# Patient Record
Sex: Female | Born: 1970 | Race: White | Hispanic: No | Smoking: Never smoker
Health system: Southern US, Community
[De-identification: ages and names within clinical notes are randomized; demographics above are authoritative.]

## PROBLEM LIST (undated history)

## (undated) DIAGNOSIS — Z8489 Family history of other specified conditions: Secondary | ICD-10-CM

## (undated) DIAGNOSIS — J45909 Unspecified asthma, uncomplicated: Secondary | ICD-10-CM

## (undated) DIAGNOSIS — M1990 Unspecified osteoarthritis, unspecified site: Secondary | ICD-10-CM

## (undated) DIAGNOSIS — Z8742 Personal history of other diseases of the female genital tract: Secondary | ICD-10-CM

## (undated) HISTORY — PX: FOOT SURGERY: SHX648

## (undated) HISTORY — DX: Personal history of other diseases of the female genital tract: Z87.42

## (undated) HISTORY — DX: Unspecified osteoarthritis, unspecified site: M19.90

## (undated) HISTORY — PX: JOINT REPLACEMENT: SHX530

## (undated) HISTORY — DX: Unspecified asthma, uncomplicated: J45.909

## (undated) HISTORY — PX: APPENDECTOMY: SHX54

---
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
## 1996-02-05 HISTORY — PX: CHOLECYSTECTOMY: SHX55

## 2009-09-14 ENCOUNTER — Ambulatory Visit: Payer: Self-pay | Admitting: Family Medicine

## 2011-10-15 ENCOUNTER — Ambulatory Visit: Payer: Self-pay

## 2012-10-23 ENCOUNTER — Encounter: Payer: Self-pay | Admitting: *Deleted

## 2012-11-02 ENCOUNTER — Other Ambulatory Visit: Payer: BC Managed Care – PPO

## 2012-11-02 ENCOUNTER — Encounter: Payer: Self-pay | Admitting: General Surgery

## 2012-11-02 ENCOUNTER — Ambulatory Visit (INDEPENDENT_AMBULATORY_CARE_PROVIDER_SITE_OTHER): Payer: BC Managed Care – PPO | Admitting: General Surgery

## 2012-11-02 VITALS — BP 120/84 | HR 68 | Resp 14 | Ht 69.5 in | Wt 229.0 lb

## 2012-11-02 DIAGNOSIS — N63 Unspecified lump in unspecified breast: Secondary | ICD-10-CM

## 2012-11-02 NOTE — Patient Instructions (Addendum)
Patient advised to continue self breast checks. She is also advised to contact our office with any new questions or concerns. Patient to return in 1 year.

## 2012-11-02 NOTE — Progress Notes (Signed)
Patient ID: Kerri Sullivan, female   DOB: Apr 23, 1970, 43 y.o.   MRN: [id number]  Chief Complaint  Patient presents with  . Other    right breast lesion    HPI Marion Seese is a 42 y.o. female who presents for an evaluation of a right breast lesion. The patient states the lesion was noticed approximately 1 year ago without any symptoms. She states she had Dr. Luella Cook check this area at that time and had a mammogram that was normal. She has noticed a second area in the last month located near her sternum. She denies any pain or symptoms except that she knows its there.    HPI  Past Medical History  Diagnosis Date  . Arthritis     rheumatoid arthritis    Past Surgical History  Procedure Laterality Date  . Cholecystectomy  1998    Family History  Problem Relation Age of Onset  . Cancer Paternal Aunt     breast  . Cancer Cousin     colon maternal side of family    Social History History  Substance Use Topics  . Smoking status: Never Smoker   . Smokeless tobacco: Never Used  . Alcohol Use: No    No Known Allergies  Current Outpatient Prescriptions  Medication Sig Dispense Refill  . methotrexate (RHEUMATREX) 2.5 MG tablet Take 1 tablet by mouth 4 (four) times a week.      . naproxen (NAPROSYN) 250 MG tablet Take 250 mg by mouth 2 (two) times daily with a meal.       No current facility-administered medications for this visit.    Review of Systems Review of Systems  Constitutional: Negative.   Respiratory: Negative.   Cardiovascular: Negative.     Blood pressure 120/84, pulse 68, resp. rate 14, height 5' 9.5" (1.765 m), weight 229 lb (103.874 kg), last menstrual period 10/14/2012.  Physical Exam Physical Exam  Constitutional: She is oriented to person, place, and time. She appears well-developed and well-nourished.  Eyes: Conjunctivae are normal. No scleral icterus.  Neck: No thyromegaly present.  Cardiovascular: Normal rate, regular rhythm and normal heart  sounds.   No murmur heard. Pulmonary/Chest: Effort normal and breath sounds normal. Right breast exhibits mass. Right breast exhibits no inverted nipple, no nipple discharge, no skin change and no tenderness. Left breast exhibits mass. Left breast exhibits no inverted nipple, no nipple discharge, no skin change and no tenderness.    Lymphadenopathy:    She has no cervical adenopathy.    She has no axillary adenopathy.  Neurological: She is alert and oriented to person, place, and time.  Skin: Skin is warm and dry.    Data Reviewed  Mammogram reportedly is normal. Ultrasound of palpable masses also normal.   Assessment    Lipomas multiple two on right one on left. No need for any intervention.     Plan    Patient to return in 1 year for follow up.        SANKAR,SEEPLAPUTHUR G 11/03/2012, 6:51 AM

## 2012-11-03 ENCOUNTER — Encounter: Payer: Self-pay | Admitting: General Surgery

## 2013-08-06 ENCOUNTER — Ambulatory Visit (INDEPENDENT_AMBULATORY_CARE_PROVIDER_SITE_OTHER): Payer: BC Managed Care – PPO | Admitting: Podiatry

## 2013-08-06 ENCOUNTER — Ambulatory Visit (INDEPENDENT_AMBULATORY_CARE_PROVIDER_SITE_OTHER): Payer: BC Managed Care – PPO

## 2013-08-06 VITALS — Ht 70.0 in | Wt 202.0 lb

## 2013-08-06 DIAGNOSIS — M722 Plantar fascial fibromatosis: Secondary | ICD-10-CM

## 2013-08-06 MED ORDER — DICLOFENAC SODIUM 75 MG PO TBEC: 75.0000 mg | DELAYED_RELEASE_TABLET | Freq: Two times a day (BID) | ORAL | Status: DC

## 2013-08-06 MED ORDER — TRIAMCINOLONE ACETONIDE 10 MG/ML IJ SUSP
10.0000 mg | Freq: Once | INTRAMUSCULAR | Status: AC
  Administered 2013-08-06: 10 mg

## 2013-08-06 NOTE — Patient Instructions (Signed)
Plantar Fasciitis (Heel Spur Syndrome) with Rehab The plantar fascia is a fibrous, ligament-like, soft-tissue structure that spans the bottom of the foot. Plantar fasciitis is a condition that causes pain in the foot due to inflammation of the tissue. SYMPTOMS   Pain and tenderness on the underneath side of the foot.  Pain that worsens with standing or walking. CAUSES  Plantar fasciitis is caused by irritation and injury to the plantar fascia on the underneath side of the foot. Common mechanisms of injury include:  Direct trauma to bottom of the foot.  Damage to a small nerve that runs under the foot where the main fascia attaches to the heel bone.  Stress placed on the plantar fascia due to bone spurs. RISK INCREASES WITH:   Activities that place stress on the plantar fascia (running, jumping, pivoting, or cutting).  Poor strength and flexibility.  Improperly fitted shoes.  Tight calf muscles.  Flat feet.  Failure to warm-up properly before activity.  Obesity. PREVENTION  Warm up and stretch properly before activity.  Allow for adequate recovery between workouts.  Maintain physical fitness:  Strength, flexibility, and endurance.  Cardiovascular fitness.  Maintain a health body weight.  Avoid stress on the plantar fascia.  Wear properly fitted shoes, including arch supports for individuals who have flat feet. PROGNOSIS  If treated properly, then the symptoms of plantar fasciitis usually resolve without surgery. However, occasionally surgery is necessary. RELATED COMPLICATIONS   Recurrent symptoms that may result in a chronic condition.  Problems of the lower back that are caused by compensating for the injury, such as limping.  Pain or weakness of the foot during push-off following surgery.  Chronic inflammation, scarring, and partial or complete fascia tear, occurring more often from repeated injections. TREATMENT  Treatment initially involves the use of  ice and medication to help reduce pain and inflammation. The use of strengthening and stretching exercises may help reduce pain with activity, especially stretches of the Achilles tendon. These exercises may be performed at home or with a therapist. Your caregiver may recommend that you use heel cups of arch supports to help reduce stress on the plantar fascia. Occasionally, corticosteroid injections are given to reduce inflammation. If symptoms persist for greater than 6 months despite non-surgical (conservative), then surgery may be recommended.  MEDICATION   If pain medication is necessary, then nonsteroidal anti-inflammatory medications, such as aspirin and ibuprofen, or other minor pain relievers, such as acetaminophen, are often recommended.  Do not take pain medication within 7 days before surgery.  Prescription pain relievers may be given if deemed necessary by your caregiver. Use only as directed and only as much as you need.  Corticosteroid injections may be given by your caregiver. These injections should be reserved for the most serious cases, because they may only be given a certain number of times. HEAT AND COLD  Cold treatment (icing) relieves pain and reduces inflammation. Cold treatment should be applied for 10 to 15 minutes every 2 to 3 hours for inflammation and pain and immediately after any activity that aggravates your symptoms. Use ice packs or massage the area with a piece of ice (ice massage).  Heat treatment may be used prior to performing the stretching and strengthening activities prescribed by your caregiver, physical therapist, or athletic trainer. Use a heat pack or soak the injury in warm water. SEEK IMMEDIATE MEDICAL CARE IF:  Treatment seems to offer no benefit, or the condition worsens.  Any medications produce adverse side effects. EXERCISES RANGE   OF MOTION (ROM) AND STRETCHING EXERCISES - Plantar Fasciitis (Heel Spur Syndrome) These exercises may help you  when beginning to rehabilitate your injury. Your symptoms may resolve with or without further involvement from your physician, physical therapist or athletic trainer. While completing these exercises, remember:   Restoring tissue flexibility helps normal motion to return to the joints. This allows healthier, less painful movement and activity.  An effective stretch should be held for at least 30 seconds.  A stretch should never be painful. You should only feel a gentle lengthening or release in the stretched tissue. RANGE OF MOTION - Toe Extension, Flexion  Sit with your right / left leg crossed over your opposite knee.  Grasp your toes and gently pull them back toward the top of your foot. You should feel a stretch on the bottom of your toes and/or foot.  Hold this stretch for __________ seconds.  Now, gently pull your toes toward the bottom of your foot. You should feel a stretch on the top of your toes and or foot.  Hold this stretch for __________ seconds. Repeat __________ times. Complete this stretch __________ times per day.  RANGE OF MOTION - Ankle Dorsiflexion, Active Assisted  Remove shoes and sit on a chair that is preferably not on a carpeted surface.  Place right / left foot under knee. Extend your opposite leg for support.  Keeping your heel down, slide your right / left foot back toward the chair until you feel a stretch at your ankle or calf. If you do not feel a stretch, slide your bottom forward to the edge of the chair, while still keeping your heel down.  Hold this stretch for __________ seconds. Repeat __________ times. Complete this stretch __________ times per day.  STRETCH - Gastroc, Standing  Place hands on wall.  Extend right / left leg, keeping the front knee somewhat bent.  Slightly point your toes inward on your back foot.  Keeping your right / left heel on the floor and your knee straight, shift your weight toward the wall, not allowing your back to  arch.  You should feel a gentle stretch in the right / left calf. Hold this position for __________ seconds. Repeat __________ times. Complete this stretch __________ times per day. STRETCH - Soleus, Standing  Place hands on wall.  Extend right / left leg, keeping the other knee somewhat bent.  Slightly point your toes inward on your back foot.  Keep your right / left heel on the floor, bend your back knee, and slightly shift your weight over the back leg so that you feel a gentle stretch deep in your back calf.  Hold this position for __________ seconds. Repeat __________ times. Complete this stretch __________ times per day. STRETCH - Gastrocsoleus, Standing  Note: This exercise can place a lot of stress on your foot and ankle. Please complete this exercise only if specifically instructed by your caregiver.   Place the ball of your right / left foot on a step, keeping your other foot firmly on the same step.  Hold on to the wall or a rail for balance.  Slowly lift your other foot, allowing your body weight to press your heel down over the edge of the step.  You should feel a stretch in your right / left calf.  Hold this position for __________ seconds.  Repeat this exercise with a slight bend in your right / left knee. Repeat __________ times. Complete this stretch __________ times per day.    STRENGTHENING EXERCISES - Plantar Fasciitis (Heel Spur Syndrome)  These exercises may help you when beginning to rehabilitate your injury. They may resolve your symptoms with or without further involvement from your physician, physical therapist or athletic trainer. While completing these exercises, remember:   Muscles can gain both the endurance and the strength needed for everyday activities through controlled exercises.  Complete these exercises as instructed by your physician, physical therapist or athletic trainer. Progress the resistance and repetitions only as guided. STRENGTH -  Towel Curls  Sit in a chair positioned on a non-carpeted surface.  Place your foot on a towel, keeping your heel on the floor.  Pull the towel toward your heel by only curling your toes. Keep your heel on the floor.  If instructed by your physician, physical therapist or athletic trainer, add ____________________ at the end of the towel. Repeat __________ times. Complete this exercise __________ times per day. STRENGTH - Ankle Inversion  Secure one end of a rubber exercise band/tubing to a fixed object (table, pole). Loop the other end around your foot just before your toes.  Place your fists between your knees. This will focus your strengthening at your ankle.  Slowly, pull your big toe up and in, making sure the band/tubing is positioned to resist the entire motion.  Hold this position for __________ seconds.  Have your muscles resist the band/tubing as it slowly pulls your foot back to the starting position. Repeat __________ times. Complete this exercises __________ times per day.  Document Released: 01/21/2005 Document Revised: 04/15/2011 Document Reviewed: 05/05/2008 [employer] Patient Information 2015 [employer]. This information is not intended to replace advice given to you by your health care provider. Make sure you discuss any questions you have with your health care provider.  

## 2013-08-06 NOTE — Progress Notes (Signed)
Subjective:     Patient ID: Kerri Sullivan, female   DOB: [birth year], 43 y.o.   MRN: [id number]  Foot Pain   patient presents stating the bottom my right heel has been awful and has not responded to immobilization which is no longer working after all these years the boot is broken and trying to reduce activity and physical therapy   Review of Systems  All other systems reviewed and are negative.      Objective:   Physical Exam  Nursing note and vitals reviewed. Constitutional: She is oriented to person, place, and time.  Cardiovascular: Intact distal pulses.   Musculoskeletal: Normal range of motion.  Neurological: She is oriented to person, place, and time.  Skin: Skin is warm.   neurovascular status intact with muscle strength adequate and range of motion subtalar midtarsal joint within normal limits. Patient is found to have exquisite discomfort and pain right plantar heel at the insertion of the tendon into the calcaneus and is noted to have digits that are well-perfused and a well-developed arch height upon weightbearing    Assessment:      severe plantar fasciitis plantar aspect right heel    Plan:     H&P and x-rays reviewed and injected the right plantar fascia 3 mg Kenalog 5 mg Xylocaine Marcaine mixture and applied fascially brace with instructions along with diclofenac 75 mg twice a day dispensed air fracture walker for usage and reappoint in 3 weeks

## 2013-08-06 NOTE — Progress Notes (Signed)
   Subjective:    Patient ID: Kerri Sullivan, female    DOB: 08-05-1970, 43 y.o.   MRN: [id number]  HPI Comments: Right plantar heel pain , the heel has flared up over the last two weeks.  Foot Pain      Review of Systems  All other systems reviewed and are negative.      Objective:   Physical Exam        Assessment & Plan:

## 2013-08-27 ENCOUNTER — Ambulatory Visit: Payer: BC Managed Care – PPO | Admitting: Podiatry

## 2013-11-04 ENCOUNTER — Ambulatory Visit: Payer: BC Managed Care – PPO | Admitting: General Surgery

## 2013-11-16 ENCOUNTER — Ambulatory Visit (INDEPENDENT_AMBULATORY_CARE_PROVIDER_SITE_OTHER): Payer: BC Managed Care – PPO | Admitting: General Surgery

## 2013-11-16 ENCOUNTER — Encounter: Payer: Self-pay | Admitting: General Surgery

## 2013-11-16 VITALS — BP 130/76 | HR 74 | Resp 12 | Ht 70.0 in | Wt 214.0 lb

## 2013-11-16 DIAGNOSIS — N6310 Unspecified lump in the right breast, unspecified quadrant: Secondary | ICD-10-CM | POA: Insufficient documentation

## 2013-11-16 DIAGNOSIS — N63 Unspecified lump in breast: Secondary | ICD-10-CM

## 2013-11-16 DIAGNOSIS — N6320 Unspecified lump in the left breast, unspecified quadrant: Principal | ICD-10-CM

## 2013-11-16 NOTE — Patient Instructions (Signed)
Continue self breast exams. Call office for any new breast issues or concerns. Follow up as needed.

## 2013-11-16 NOTE — Progress Notes (Signed)
Patient ID: Kerri Sullivan, female   DOB: 03/08/1970, 43 y.o.   MRN: [id number]  Chief Complaint  Patient presents with  . Follow-up    mammogram    HPI Kerri Sullivan is a 43 y.o. female.  who presents for her follow up mammogram and breast evaluation. The most recent mammogram was done on 11-09-13.  Patient does perform regular self breast checks and gets regular mammograms done.  No new breast issues. A year ago she was noted  to have small superficial masses at medial end of each breast. No change noted on these per patient.   HPI  Past Medical History  Diagnosis Date  . Arthritis     rheumatoid arthritis    Past Surgical History  Procedure Laterality Date  . Cholecystectomy  1998    Family History  Problem Relation Age of Onset  . Cancer Paternal Aunt     breast  . Cancer Cousin     colon maternal side of family    Social History History  Substance Use Topics  . Smoking status: Never Smoker   . Smokeless tobacco: Never Used  . Alcohol Use: No    No Known Allergies  No current outpatient prescriptions on file.   No current facility-administered medications for this visit.    Review of Systems Review of Systems  Constitutional: Negative.   Respiratory: Negative.   Cardiovascular: Negative.     Blood pressure 130/76, pulse 74, resp. rate 12, height 5\' 10"  (1.778 m), weight 214 lb (97.07 kg), last menstrual period 10/28/2013.  Physical Exam Physical Exam  Constitutional: She is oriented to person, place, and time. She appears well-developed and well-nourished.  Eyes: Conjunctivae are normal. No scleral icterus.  Neck: Neck supple. No thyromegaly present.  Cardiovascular: Normal rate, regular rhythm and normal heart sounds.   Pulmonary/Chest: Effort normal and breath sounds normal. Right breast exhibits mass (Mass present. Unchanged from before). Right breast exhibits no inverted nipple, no nipple discharge, no skin change and no tenderness. Left breast exhibits  mass (Mass left breast. Unchanged from before). Left breast exhibits no inverted nipple, no nipple discharge, no skin change and no tenderness.  Abdominal: Soft. Bowel sounds are normal. There is no hepatosplenomegaly. There is no tenderness.  Lymphadenopathy:    She has no cervical adenopathy.    She has no axillary adenopathy.  Neurological: She is alert and oriented to person, place, and time.  Skin: Skin is warm and dry.    Data Reviewed Mammogram reviewed and stable  Assessment    Two small masses medial end of breasts. Likely lipomas. Previous ultrasound was normal. No need for intervention.     Plan    Continue monthly self breast exams along with annual mammograms with physician exam. Call office for any new breast issues or concerns. Follow up as needed.       SANKAR,SEEPLAPUTHUR G 11/16/2013, 8:09 PM

## 2013-12-06 ENCOUNTER — Encounter: Payer: Self-pay | Admitting: General Surgery

## 2015-09-06 ENCOUNTER — Encounter: Payer: Self-pay | Admitting: *Deleted

## 2015-09-07 ENCOUNTER — Ambulatory Visit (INDEPENDENT_AMBULATORY_CARE_PROVIDER_SITE_OTHER): Payer: BC Managed Care – PPO | Admitting: General Surgery

## 2015-09-07 ENCOUNTER — Encounter: Payer: Self-pay | Admitting: General Surgery

## 2015-09-07 VITALS — BP 128/78 | HR 88 | Resp 12 | Ht 70.0 in | Wt 209.0 lb

## 2015-09-07 DIAGNOSIS — D170 Benign lipomatous neoplasm of skin and subcutaneous tissue of head, face and neck: Secondary | ICD-10-CM | POA: Diagnosis not present

## 2015-09-07 NOTE — Patient Instructions (Addendum)
Recommend excision of mass with same day surgery The patient is aware to call back for any questions or concerns.  The patient has been scheduled for surgery at [facility] on 09/21/15. She will pre admit by phone. The patient is aware of date and instructions.

## 2015-09-07 NOTE — Progress Notes (Addendum)
Patient ID: Kerri Sullivan, female   DOB: [birth year], 45 y.o.   MRN: [id number]  Chief Complaint  Patient presents with  . Lipoma    HPI Kerri Sullivan is a 45 y.o. female is here today for an evaluation of a lipoma on her upper back/base of the neck. She state sit has been there for 5 years but has gotten larger over the past year. No pain but she does have some right shoulder discomfort occasionally. No other lesions like this one exist on the rest of her body. 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 I have reviewed the history of present illness with the patient.  HPI  Past Medical History:  Diagnosis Date  . Arthritis    rheumatoid arthritis    Past Surgical History:  Procedure Laterality Date  . CHOLECYSTECTOMY  1998    Family History  Problem Relation Age of Onset  . Cancer Paternal Aunt     breast  . Cancer Cousin     colon maternal side of family    Social History Social History  Substance Use Topics  . Smoking status: Never Smoker  . Smokeless tobacco: Never Used  . Alcohol use No    No Known Allergies  No current outpatient prescriptions on file.   No current facility-administered medications for this visit.     Review of Systems Review of Systems  Constitutional: Negative.   Respiratory: Negative.   Cardiovascular: Negative.     Blood pressure 128/78, pulse 88, resp. rate 12, height 5\' 10"  (1.778 m), weight 209 lb (94.8 kg), last menstrual period 08/28/2015.  Physical Exam Physical Exam  Constitutional: She is oriented to person, place, and time. She appears well-developed and well-nourished.  Neck: Neck supple.    Cardiovascular: Normal rate, regular rhythm and normal heart sounds.   Pulmonary/Chest: Effort normal and breath sounds normal.  Lymphadenopathy:    She has no cervical adenopathy.  Neurological: She is alert and oriented to person, place, and time.  Skin: Skin is warm and dry.  5 x 4 cm soft subcutaneous mass right  posterior base of the neck.  Psychiatric: Her behavior is normal.    Data Reviewed Notes provided by PCP  Assessment    Lipoma at the base of the neck    Plan    Excision. The risks and benefits of a lipoma excision were discussed with the patient.     The patient has been scheduled for surgery at [facility] on 09/21/15. She will pre admit by phone. The patient is aware of date and instructions.  This information has been scribed by Karie Fetch RN, BSN,BC.  Kerri Sullivan 09/07/2015, 4:44 PM

## 2015-09-13 ENCOUNTER — Encounter
Admission: RE | Admit: 2015-09-13 | Discharge: 2015-09-13 | Disposition: A | Discharge status: Home / Self Care | Payer: BC Managed Care – PPO | Source: Ambulatory Visit | Attending: General Surgery | Admitting: General Surgery

## 2015-09-13 HISTORY — DX: Family history of other specified conditions: Z84.89

## 2015-09-13 NOTE — Patient Instructions (Signed)
  Your procedure is scheduled on: 09-21-15 Report to Same Day Surgery 2nd floor medical mall To find out your arrival time please call [phone] between 1PM - 3PM on 09-20-15  Remember: Instructions that are not followed completely may result in serious medical risk, up to and including death, or upon the discretion of your surgeon and anesthesiologist your surgery may need to be rescheduled.    _x___ 1. Do not eat food or drink liquids after midnight. No gum chewing or hard candies.     __x__ 2. No Alcohol for 24 hours before or after surgery.   __x__3. No Smoking for 24 prior to surgery.   ____  4. Bring all medications with you on the day of surgery if instructed.    __x__ 5. Notify your doctor if there is any change in your medical condition     (cold, fever, infections).     Do not wear jewelry, make-up, hairpins, clips or nail polish.  Do not wear lotions, powders, or perfumes. You may wear deodorant.  Do not shave 48 hours prior to surgery. Men may shave face and neck.  Do not bring valuables to the hospital.    [facility] is not responsible for any belongings or valuables.               Contacts, dentures or bridgework may not be worn into surgery.  Leave your suitcase in the car. After surgery it may be brought to your room.  For patients admitted to the hospital, discharge time is determined by your treatment team.   Patients discharged the day of surgery will not be allowed to drive home.    Please read over the following fact sheets that you were given:   [facility] Preparing for Surgery and or MRSA Information   ____ Take these medicines the morning of surgery with A SIP OF WATER:    1. NONE  2.  3.  4.  5.  6.  ____ Fleet Enema (as directed)   ____ Use CHG Soap or sage wipes as directed on instruction sheet   ____ Use inhalers on the day of surgery and bring to hospital day of surgery  ____ Stop metformin 2 days prior to surgery    ____ Take 1/2 of  usual insulin dose the night before surgery and none on the morning of  surgery.   ____ Stop aspirin or coumadin, or plavix  _x__ Stop Anti-inflammatories such as Advil, Aleve, Ibuprofen, Motrin, Naproxen,          Naprosyn, Goodies powders or aspirin products NOW-Ok to take Tylenol.   ____ Stop supplements until after surgery.    ____ Bring C-Pap to the hospital.

## 2015-09-21 ENCOUNTER — Ambulatory Visit: Payer: BC Managed Care – PPO | Admitting: Anesthesiology

## 2015-09-21 ENCOUNTER — Encounter: Payer: Self-pay | Admitting: *Deleted

## 2015-09-21 ENCOUNTER — Encounter
Admission: RE | Disposition: A | Discharge status: Home / Self Care | Payer: Self-pay | Source: Ambulatory Visit | Attending: General Surgery

## 2015-09-21 ENCOUNTER — Ambulatory Visit
Admission: RE | Admit: 2015-09-21 | Discharge: 2015-09-21 | Disposition: A | Discharge status: Home / Self Care | Payer: BC Managed Care – PPO | Source: Ambulatory Visit | Attending: General Surgery | Admitting: General Surgery

## 2015-09-21 DIAGNOSIS — M069 Rheumatoid arthritis, unspecified: Secondary | ICD-10-CM | POA: Insufficient documentation

## 2015-09-21 DIAGNOSIS — D170 Benign lipomatous neoplasm of skin and subcutaneous tissue of head, face and neck: Secondary | ICD-10-CM | POA: Diagnosis not present

## 2015-09-21 HISTORY — PX: LIPOMA EXCISION: SHX5283

## 2015-09-21 LAB — POCT PREGNANCY, URINE: Preg Test, Ur: NEGATIVE

## 2015-09-21 SURGERY — EXCISION LIPOMA
Anesthesia: General | Wound class: Clean

## 2015-09-21 MED ORDER — CHLORHEXIDINE GLUCONATE CLOTH 2 % EX PADS
6.0000 | MEDICATED_PAD | Freq: Once | CUTANEOUS | Status: DC
Start: 2015-09-21 — End: 2015-09-21

## 2015-09-21 MED ORDER — SUCCINYLCHOLINE CHLORIDE 20 MG/ML IJ SOLN
INTRAMUSCULAR | Status: DC | PRN
  Administered 2015-09-21: 100 mg via INTRAVENOUS

## 2015-09-21 MED ORDER — LIDOCAINE-EPINEPHRINE 1 %-1:100000 IJ SOLN
INTRAMUSCULAR | Status: AC
  Filled 2015-09-21: qty 1

## 2015-09-21 MED ORDER — ONDANSETRON HCL 4 MG/2ML IJ SOLN: 4.0000 mg | Freq: Once | INTRAMUSCULAR | Status: DC | PRN

## 2015-09-21 MED ORDER — MIDAZOLAM HCL 2 MG/2ML IJ SOLN
INTRAMUSCULAR | Status: DC | PRN
  Administered 2015-09-21: 2 mg via INTRAVENOUS

## 2015-09-21 MED ORDER — TRAMADOL HCL 50 MG PO TABS: 50.0000 mg | ORAL_TABLET | Freq: Four times a day (QID) | ORAL | 0 refills | Status: DC | PRN

## 2015-09-21 MED ORDER — FAMOTIDINE 20 MG PO TABS
ORAL_TABLET | ORAL | Status: AC
  Administered 2015-09-21: 20 mg via ORAL
  Filled 2015-09-21: qty 1

## 2015-09-21 MED ORDER — PROPOFOL 10 MG/ML IV BOLUS
INTRAVENOUS | Status: DC | PRN
  Administered 2015-09-21: 150 mg via INTRAVENOUS
  Administered 2015-09-21: 50 mg via INTRAVENOUS

## 2015-09-21 MED ORDER — FAMOTIDINE 20 MG PO TABS
20.0000 mg | ORAL_TABLET | Freq: Once | ORAL | Status: AC
  Administered 2015-09-21: 20 mg via ORAL

## 2015-09-21 MED ORDER — BUPIVACAINE HCL (PF) 0.5 % IJ SOLN
INTRAMUSCULAR | Status: DC | PRN
  Administered 2015-09-21: 20 mL

## 2015-09-21 MED ORDER — ROCURONIUM BROMIDE 100 MG/10ML IV SOLN
INTRAVENOUS | Status: DC | PRN
  Administered 2015-09-21: 10 mg via INTRAVENOUS

## 2015-09-21 MED ORDER — BUPIVACAINE HCL (PF) 0.5 % IJ SOLN
INTRAMUSCULAR | Status: AC
  Filled 2015-09-21: qty 30

## 2015-09-21 MED ORDER — FENTANYL CITRATE (PF) 100 MCG/2ML IJ SOLN
INTRAMUSCULAR | Status: DC | PRN
  Administered 2015-09-21: 50 ug via INTRAVENOUS
  Administered 2015-09-21: 100 ug via INTRAVENOUS

## 2015-09-21 MED ORDER — FENTANYL CITRATE (PF) 100 MCG/2ML IJ SOLN: 25.0000 ug | INTRAMUSCULAR | Status: DC | PRN

## 2015-09-21 MED ORDER — LACTATED RINGERS IV SOLN
INTRAVENOUS | Status: DC
  Administered 2015-09-21: 07:00:00 via INTRAVENOUS

## 2015-09-21 MED ORDER — DEXAMETHASONE SODIUM PHOSPHATE 10 MG/ML IJ SOLN
INTRAMUSCULAR | Status: DC | PRN
  Administered 2015-09-21: 10 mg via INTRAVENOUS

## 2015-09-21 MED ORDER — ONDANSETRON HCL 4 MG/2ML IJ SOLN
INTRAMUSCULAR | Status: DC | PRN
  Administered 2015-09-21: 4 mg via INTRAVENOUS

## 2015-09-21 SURGICAL SUPPLY — 28 items
CANISTER SUCT 1200ML W/VALVE (MISCELLANEOUS) ×3 IMPLANT
CHLORAPREP W/TINT 26ML (MISCELLANEOUS) ×3 IMPLANT
CLOSURE WOUND 1/2 X4 (GAUZE/BANDAGES/DRESSINGS)
DRAPE LAPAROTOMY 100X77 ABD (DRAPES) ×3 IMPLANT
DRESSING TELFA 4X3 1S ST N-ADH (GAUZE/BANDAGES/DRESSINGS) IMPLANT
DRSG TEGADERM 4X4.75 (GAUZE/BANDAGES/DRESSINGS) IMPLANT
ELECT REM PT RETURN 9FT ADLT (ELECTROSURGICAL) ×3
ELECTRODE REM PT RTRN 9FT ADLT (ELECTROSURGICAL) ×1 IMPLANT
GAUZE SPONGE 4X4 12PLY STRL (GAUZE/BANDAGES/DRESSINGS) IMPLANT
GLOVE BIO SURGEON STRL SZ7 (GLOVE) ×18 IMPLANT
GOWN STRL REUS W/ TWL LRG LVL3 (GOWN DISPOSABLE) ×3 IMPLANT
GOWN STRL REUS W/TWL LRG LVL3 (GOWN DISPOSABLE) ×6
KIT RM TURNOVER STRD PROC AR (KITS) ×3 IMPLANT
LABEL OR SOLS (LABEL) ×3 IMPLANT
LIQUID BAND (GAUZE/BANDAGES/DRESSINGS) ×3 IMPLANT
NEEDLE HYPO 25X1 1.5 SAFETY (NEEDLE) ×3 IMPLANT
PACK BASIN MINOR ARMC (MISCELLANEOUS) ×3 IMPLANT
STRIP CLOSURE SKIN 1/2X4 (GAUZE/BANDAGES/DRESSINGS) IMPLANT
SUT ETHILON 4-0 (SUTURE)
SUT ETHILON 4-0 FS2 18XMFL BLK (SUTURE)
SUT VIC AB 2-0 CT1 (SUTURE) ×3 IMPLANT
SUT VIC AB 3-0 SH 27 (SUTURE)
SUT VIC AB 3-0 SH 27X BRD (SUTURE) IMPLANT
SUT VIC AB 4-0 FS2 27 (SUTURE) ×3 IMPLANT
SUT VICRYL+ 3-0 144IN (SUTURE) IMPLANT
SUTURE ETHLN 4-0 FS2 18XMF BLK (SUTURE) IMPLANT
SWABSTK COMLB BENZOIN TINCTURE (MISCELLANEOUS) IMPLANT
SYR CONTROL 10ML (SYRINGE) ×3 IMPLANT

## 2015-09-21 NOTE — Op Note (Addendum)
Preop diagnosis: Lipoma base of neck posterior  Post op diagnosis: Same  Operation: Excision lipoma back of neck involving the fascia  Surgeon: Mckinley Jewel  Assistant:     Anesthesia: Gen.  Complications: None  EBL: Less than 10 mL  Drains: None  Description: Patient was put to sleep with an endotracheal tube on the stretcher and then placed in the prone position the operating table with attention to directed to all pressure points and the proper positioning. The area the lipoma located to the right of the midline in the base of the neck was adequately exposed and then prepped and draped as sterile field. Timeout was. A total of 20 mL of 0.5% Marcaine was instilled for postop analgesia. Skin incision was made transversely overlying this mass and then deepened through to find a lipoma that was not well circumscribed but appeared to be tethered in places to the subcutaneous tissue and skin and also to the fascia below. With both blunt and sharp dissection the lipoma was subsequently excised out fully taking a small amount of fascia and underlying muscle which was involved with this. Bleeding was controlled cautery. After ensuring hemostasis the area was irrigated out and closed with with 2-0 Vicryl in the deeper tissue. Skin closed with subcuticular 4-0 Vicryl covered with liqui  Ban. The excised specimen was sent to pathology in formalin. Patient was subsequently extubated and versed to the supine position on the stretcher and then moved to recovery room in stable condition

## 2015-09-21 NOTE — Interval H&P Note (Signed)
History and Physical Interval Note:  09/21/2015 6:59 AM  Kerri Sullivan  has presented today for surgery, with the diagnosis of [place]  The various methods of treatment have been discussed with the patient and family. After consideration of risks, benefits and other options for treatment, the patient has consented to  Procedure(s): [place] (N/A) as a surgical intervention .  The patient's history has been reviewed, patient examined, no change in status, stable for surgery.  I have reviewed the patient's chart and labs.  Questions were answered to the patient's satisfaction.     [name] G

## 2015-09-21 NOTE — Anesthesia Procedure Notes (Signed)
Procedure Name: Intubation Date/Time: 09/21/2015 7:41 AM Performed by: Jonna Clark Pre-anesthesia Checklist: Patient identified, Patient being monitored, Timeout performed, Emergency Drugs available and Suction available Patient Re-evaluated:Patient Re-evaluated prior to inductionOxygen Delivery Method: Circle system utilized Preoxygenation: Pre-oxygenation with 100% oxygen Intubation Type: IV induction Ventilation: Mask ventilation without difficulty Laryngoscope Size: Mac and 3 Grade View: Grade I Tube type: Oral Tube size: 7.0 mm Number of attempts: 1 Placement Confirmation: ETT inserted through vocal cords under direct vision,  positive ETCO2 and breath sounds checked- equal and bilateral Secured at: 21 cm Tube secured with: Tape Dental Injury: Teeth and Oropharynx as per pre-operative assessment

## 2015-09-21 NOTE — Transfer of Care (Signed)
Immediate Anesthesia Transfer of Care Note  Patient: Kerri Sullivan  Procedure(s) Performed: Procedure(s): EXCISION LIPOMA BACK OF NECK (N/A)  Patient Location: PACU  Anesthesia Type:General  Level of Consciousness: patient cooperative and lethargic  Airway & Oxygen Therapy: Patient Spontanous Breathing and Patient connected to face mask oxygen  Post-op Assessment: Report given to RN and Post -op Vital signs reviewed and stable  Post vital signs: Reviewed and stable  Last Vitals:  Vitals:   09/21/15 0608  BP: 126/87  Pulse: 88  Resp: 16  Temp: 36.6 C    Last Pain:  Vitals:   09/21/15 0608  TempSrc: Tympanic  PainSc: 0-No pain         Complications: No apparent anesthesia complications

## 2015-09-21 NOTE — Anesthesia Preprocedure Evaluation (Addendum)
Anesthesia Evaluation  Patient identified by MRN, date of birth, ID band Patient awake  General Assessment Comment:Mom had N/V  Reviewed: Allergy & Precautions, NPO status , Patient's Chart, lab work & pertinent test results  History of Anesthesia Complications (+) Family history of anesthesia reaction  Airway Mallampati: II  TM Distance: >3 FB     Dental no notable dental hx.    Pulmonary neg pulmonary ROS,    Pulmonary exam normal        Cardiovascular negative cardio ROS Normal cardiovascular exam     Neuro/Psych negative neurological ROS  negative psych ROS   GI/Hepatic negative GI ROS, Neg liver ROS,   Endo/Other  negative endocrine ROS  Renal/GU negative Renal ROS  negative genitourinary   Musculoskeletal  (+) Arthritis , Rheumatoid disorders,    Abdominal Normal abdominal exam  (+)   Peds negative pediatric ROS (+)  Hematology negative hematology ROS (+)   Anesthesia Other Findings   Reproductive/Obstetrics                             Anesthesia Physical Anesthesia Plan  ASA: II  Anesthesia Plan: General   Post-op Pain Management:    Induction: Intravenous  Airway Management Planned:   Additional Equipment:   Intra-op Plan:   Post-operative Plan:   Informed Consent: I have reviewed the patients History and Physical, chart, labs and discussed the procedure including the risks, benefits and alternatives for the proposed anesthesia with the patient or authorized representative who has indicated his/her understanding and acceptance.   Dental advisory given  Plan Discussed with: CRNA and Surgeon  Anesthesia Plan Comments:         Anesthesia Quick Evaluation

## 2015-09-21 NOTE — H&P (View-Only) (Signed)
Patient ID: Kerri Sullivan, female   DOB: 1970-11-24, 45 y.o.   MRN: [id number]  Chief Complaint  Patient presents with  . Lipoma    HPI Kerri Sullivan is a 45 y.o. female is here today for an evaluation of a lipoma on her upper back/base of the neck. She state sit has been there for 5 years but has gotten larger over the past year. No pain but she does have some right shoulder discomfort occasionally. No other lesions like this one exist on the rest of her body. 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 I have reviewed the history of present illness with the patient.  HPI  Past Medical History:  Diagnosis Date  . Arthritis    rheumatoid arthritis    Past Surgical History:  Procedure Laterality Date  . CHOLECYSTECTOMY  1998    Family History  Problem Relation Age of Onset  . Cancer Paternal Aunt     breast  . Cancer Cousin     colon maternal side of family    Social History Social History  Substance Use Topics  . Smoking status: Never Smoker  . Smokeless tobacco: Never Used  . Alcohol use No    No Known Allergies  No current outpatient prescriptions on file.   No current facility-administered medications for this visit.     Review of Systems Review of Systems  Constitutional: Negative.   Respiratory: Negative.   Cardiovascular: Negative.     Blood pressure 128/78, pulse 88, resp. rate 12, height 5\' 10"  (1.778 m), weight 209 lb (94.8 kg), last menstrual period 08/28/2015.  Physical Exam Physical Exam  Constitutional: She is oriented to person, place, and time. She appears well-developed and well-nourished.  Neck: Neck supple.    Cardiovascular: Normal rate, regular rhythm and normal heart sounds.   Pulmonary/Chest: Effort normal and breath sounds normal.  Lymphadenopathy:    She has no cervical adenopathy.  Neurological: She is alert and oriented to person, place, and time.  Skin: Skin is warm and dry.  5 x 4 cm soft subcutaneous mass right  posterior base of the neck.  Psychiatric: Her behavior is normal.    Data Reviewed Notes provided by PCP  Assessment    Lipoma at the base of the neck    Plan    Excision. The risks and benefits of a lipoma excision were discussed with the patient.     The patient has been scheduled for surgery at [facility] on 09/21/15. She will pre admit by phone. The patient is aware of date and instructions.  This information has been scribed by Karie Fetch RN, BSN,BC.  Kerri Sullivan 09/07/2015, 4:44 PM

## 2015-09-21 NOTE — Anesthesia Postprocedure Evaluation (Signed)
Anesthesia Post Note  Patient: Kerri Sullivan  Procedure(s) Performed: Procedure(s) (LRB): EXCISION LIPOMA BACK OF NECK (N/A)  Patient location during evaluation: PACU Anesthesia Type: General Level of consciousness: awake and alert and oriented Pain management: pain level controlled Vital Signs Assessment: post-procedure vital signs reviewed and stable Respiratory status: spontaneous breathing Cardiovascular status: blood pressure returned to baseline Anesthetic complications: no    Last Vitals:  Vitals:   09/21/15 0933 09/21/15 0948  BP: (!) 151/92 (!) 151/84  Pulse: 75 74  Resp: 16   Temp: 36.4 C     Last Pain:  Vitals:   09/21/15 0948  TempSrc:   PainSc: 0-No pain                 Aubrielle Stroud

## 2015-09-21 NOTE — Discharge Instructions (Signed)
AMBULATORY SURGERY  °DISCHARGE INSTRUCTIONS ° ° °1) The drugs that you were given will stay in your system until tomorrow so for the next 24 hours you should not: ° °A) Drive an automobile °B) Make any legal decisions °C) Drink any alcoholic beverage ° ° °2) You may resume regular meals tomorrow.  Today it is better to start with liquids and gradually work up to solid foods. ° °You may eat anything you prefer, but it is better to start with liquids, then soup and crackers, and gradually work up to solid foods. ° ° °3) Please notify your doctor immediately if you have any unusual bleeding, trouble breathing, redness and pain at the surgery site, drainage, fever, or pain not relieved by medication. ° °

## 2015-09-25 LAB — SURGICAL PATHOLOGY

## 2015-09-26 ENCOUNTER — Telehealth: Payer: Self-pay | Admitting: *Deleted

## 2015-09-26 NOTE — Telephone Encounter (Signed)
Notified patient as instructed, patient pleased. Discussed follow-up appointments, patient agrees  

## 2015-09-26 NOTE — Telephone Encounter (Signed)
-----   Message from Christene Lye, MD sent at 09/26/2015  8:55 AM EDT ----- Rosann Auerbach, please let pt pt know the pathology was normal.

## 2015-09-28 ENCOUNTER — Encounter: Payer: Self-pay | Admitting: General Surgery

## 2015-09-28 ENCOUNTER — Ambulatory Visit (INDEPENDENT_AMBULATORY_CARE_PROVIDER_SITE_OTHER): Payer: BC Managed Care – PPO | Admitting: General Surgery

## 2015-09-28 VITALS — BP 142/88 | HR 86 | Resp 14 | Ht 70.0 in | Wt 210.0 lb

## 2015-09-28 DIAGNOSIS — D170 Benign lipomatous neoplasm of skin and subcutaneous tissue of head, face and neck: Secondary | ICD-10-CM

## 2015-09-28 NOTE — Patient Instructions (Signed)
Return as needed

## 2015-09-28 NOTE — Progress Notes (Signed)
Patient ID: Kerri Sullivan, female   DOB: 07-10-1970, 45 y.o.   MRN: [id number]  Chief Complaint  Patient presents with  . Follow-up    lipoma    HPI Kerri Sullivan is a 45 y.o. female here today for her post op excision neck lipoma removed on 09/21/15. Patient states she is doing well. She notes that she is able to sleep on the right side and does not feel any pain. I have reviewed the history of present illness with the patient.   Marland KitchenHPI  Past Medical History:  Diagnosis Date  . Arthritis    rheumatoid arthritis-OFF METHOTREXATE SINCE 2016 AND DOING WELL PER PT (09-13-15)  . Family history of adverse reaction to anesthesia    MOM-N/V    Past Surgical History:  Procedure Laterality Date  . CHOLECYSTECTOMY  1998  . FOOT SURGERY Right   . LIPOMA EXCISION N/A 09/21/2015   Procedure: EXCISION LIPOMA BACK OF NECK;  Surgeon: Christene Lye, MD;  Location: [facility] ORS;  Service: General;  Laterality: N/A;    Family History  Problem Relation Age of Onset  . Cancer Paternal Aunt     breast  . Cancer Cousin     colon maternal side of family    Social History Social History  Substance Use Topics  . Smoking status: Never Smoker  . Smokeless tobacco: Never Used  . Alcohol use Yes     Comment: RARE    No Known Allergies  Current Outpatient Prescriptions  Medication Sig Dispense Refill  . Multiple Vitamin (MULTIVITAMIN) tablet Take 1 tablet by mouth daily.    . naproxen sodium (ANAPROX) 220 MG tablet Take 220 mg by mouth 2 (two) times daily as needed (inflammation).    . traMADol (ULTRAM) 50 MG tablet Take 1 tablet (50 mg total) by mouth every 6 (six) hours as needed. 30 tablet 0   No current facility-administered medications for this visit.     Review of Systems Review of Systems  Constitutional: Negative.   Respiratory: Negative.   Cardiovascular: Negative.     Blood pressure (!) 142/88, pulse 86, resp. rate 14, height 5\' 10"  (1.778 m), weight 210 lb (95.3 kg), last  menstrual period 08/28/2015.  Physical Exam Physical Exam  Constitutional: She is oriented to person, place, and time. She appears well-developed and well-nourished.  Neurological: She is alert and oriented to person, place, and time.  Skin: Skin is warm and dry.       Data Reviewed Notes reviewed  Path- benign lipoma Assessment    Post op excision of lipoma right posterior neck. Small seroma -may resolve on it's own.     Plan   Patient to return as needed.  This information has been scribed by Gaspar Cola CMA.        SANKAR,SEEPLAPUTHUR G 09/28/2015, 9:09 AM

## 2016-01-03 ENCOUNTER — Ambulatory Visit: Payer: BC Managed Care – PPO | Attending: Rheumatology | Admitting: Physical Therapy

## 2016-01-03 DIAGNOSIS — M25521 Pain in right elbow: Secondary | ICD-10-CM | POA: Insufficient documentation

## 2016-01-04 NOTE — Therapy (Signed)
[place] [facility] [address], [place], [address] Phone: [phone]   Fax:  [phone]  Physical Therapy Evaluation  Patient Details  Name: Kerri Sullivan MRN: [id number] Date of Birth: [birth year] Referring Provider: Dr. Percell Boston  Encounter Date: 01/03/2016      PT End of Session - 01/04/16 0917    Visit Number 1   Number of Visits 9   Date for PT Re-Evaluation 02/15/16   PT Start Time [id number]   PT Stop Time 1735   PT Time Calculation (min) 50 min   Activity Tolerance Patient tolerated treatment well   Behavior During Therapy [facility] for tasks assessed/performed      Past Medical History:  Diagnosis Date  . Arthritis    rheumatoid arthritis-OFF METHOTREXATE SINCE 2016 AND DOING WELL PER PT (09-13-15)  . Family history of adverse reaction to anesthesia    MOM-N/V    Past Surgical History:  Procedure Laterality Date  . CHOLECYSTECTOMY  1998  . FOOT SURGERY Right   . LIPOMA EXCISION N/A 09/21/2015   Procedure: EXCISION LIPOMA BACK OF NECK;  Surgeon: Christene Lye, MD;  Location: [facility] ORS;  Service: General;  Laterality: N/A;    There were no vitals filed for this visit.       Subjective Assessment - 01/04/16 0922    Subjective Patient reports she has had R shoulder, elbow pain for roughly 3-4 years with no specific MOI. She reports her R shoulder is throbbing at night, generally her RA pain is bilateral during onset. She reports pain now is mostly around medial elbow. She reports numbness/tingling first thing in the morning, declines after. She reports she has not been dropping objects. Denies any neck pain, issues with swallowing, vision. She reports the pain is quite variable, some days nearly unbearable, others she has little to no pain in the elbow.    Pertinent History Patient has a history of RA diagnosed 6 years ago.    Limitations House hold activities;Writing   Diagnostic tests May go in  for an NCV test.    Patient Stated Goals To reduce the pain she is experiencing to allow her to sleep better at night, be more involved in exercise and activity.    Currently in Pain? No/denies            [facility] PT Assessment - 01/04/16 0926      Assessment   Medical Diagnosis R elbow tendinitis   Referring Provider Dr. Percell Boston   Prior Therapy No PT     Precautions   Precautions None     Restrictions   Weight Bearing Restrictions No     Balance Screen   Has the patient fallen in the past 6 months No     [place] residence     Prior Function   Level of Independence Independent     Cognition   Overall Cognitive Status Within Functional Limits for tasks assessed     Observation/Other Assessments   Quick DASH  29.5     Observation/Other Assessments-Edema    Edema --  37 cm at joint line, 38 cm (3cm above joint) bilaterally     Sensation   Light Touch Appears Intact     Strength   Right Shoulder Flexion 5/5   Right Shoulder ABduction 4/5  Pain limited   Right Shoulder Internal Rotation 5/5   Right Shoulder External Rotation 5/5   Left  Shoulder Flexion 5/5   Left Shoulder ABduction 5/5   Left Shoulder Internal Rotation 5/5   Left Shoulder External Rotation 5/5   Right Elbow Flexion 4/5  Pain limited   Right Elbow Extension 5/5   Left Elbow Flexion 5/5   Left Elbow Extension 5/5   Right Forearm Pronation 4/5  Pain limited   Right Forearm Supination 5/5   Left Forearm Pronation 5/5   Left Forearm Supination 5/5   Right Wrist Flexion 5/5   Right Wrist Extension 5/5   Right Wrist Radial Deviation 5/5   Right Wrist Ulnar Deviation 5/5   Left Wrist Flexion 5/5   Left Wrist Extension 5/5   Left Wrist Radial Deviation 5/5   Left Wrist Ulnar Deviation 5/5   Right Hand Grip (lbs) 30   Left Hand Grip (lbs) 80     Spurling's   Findings Negative   Comment --  Negative with extension and rotation      Soft  tissue mobilization to distal biceps area, above joint line, where patient reported pain. Multiple palpable areas of pain, which relieved with prolonged time with STM.   Isometric pronation x 10 after STM. Patient reported significant decline in pain (no pain) on resisted pronation, which had initially been painful.                      PT Education - 01/04/16 [id number]    Education provided Yes   Education Details Cervical spine appears normal, strength appears normal, does not sound like a joint pain, more representative of a muscular/tendinous pain though will closely monitor.    Person(s) Educated Patient   Methods Explanation;Demonstration;Handout   Comprehension Verbalized understanding;Returned demonstration             PT Long Term Goals - 01/04/16 0939      PT LONG TERM GOAL #1   Title Patient will report a QuickDash score of less than 20% to demonstrate improved tolerance for ADLs.    Baseline 29.5%   Time 8   Period Weeks   Status New     PT LONG TERM GOAL #2   Title Patient will report she is able to sleep consistently through the night without elbow pain for improved quality of life.    Baseline Wakes up 2/3 of the nights or more.    Time 8   Period Weeks   Status New     PT LONG TERM GOAL #3   Title Patient will demonstrate improved grip strength of at least 60# on RUE to improve ability to complete ADLs.    Baseline 30# on R, 80# on L.   Time 8   Period Weeks   Status New               Plan - 01/04/16 [id number]    Clinical Impression Statement Patient presents with history of RA, R elbow pain of insidious onset. She does not appear to have any cervical involvement on testing, this does not appear consistent with radicular pain either. She does have R grip strength deficit. Pain with pronation but not supination, pain with palpation of medial biceps tendon area, above joint line. Patient reports feeling of swelling just superior to medial  epicondyle, on measurement there does not appear to be any swelling. She does have mechanical reproduction with bicep curl with forearm supinated, not pronated. After soft tissue mobilization and isometrics she reported decline in medial elbow pain in this session, though today  was not a "bad day". Patient will benefit from skilled PT services to address her functional deficits.    Rehab Potential Good   Clinical Impairments Affecting Rehab Potential Patient has rheumatoid arthritis.    PT Frequency 1x / week   PT Duration 8 weeks   PT Treatment/Interventions Manual techniques;Iontophoresis 4mg /ml Dexamethasone;Therapeutic activities;Therapeutic exercise;Neuromuscular re-education;Taping   PT Next Visit Plan Soft tissue mobilization, isometrics, progressive loading of biceps if indicated.    PT Home Exercise Plan Massage on painful spots in elbow, isometric pronation.    Consulted and Agree with Plan of Care Patient      Patient will benefit from skilled therapeutic intervention in order to improve the following deficits and impairments:  Pain, Impaired UE functional use  Visit Diagnosis: Pain in right elbow - Plan: PT plan of care cert/re-cert     Problem List Patient Active Problem List   Diagnosis Date Noted  . Masses of both breasts 11/16/2013    Kerman Passey, PT, DPT    01/04/2016, 10:17 AM  [place] [facility] [address], [place], [address] Phone: [phone]   Fax:  [phone]  Name: SHERNELL JENNISON MRN: [id number] Date of Birth: April 27, 1970

## 2016-01-08 ENCOUNTER — Encounter: Payer: BC Managed Care – PPO | Admitting: Physical Therapy

## 2016-01-10 ENCOUNTER — Encounter: Payer: BC Managed Care – PPO | Admitting: Physical Therapy

## 2016-01-11 ENCOUNTER — Ambulatory Visit: Payer: BC Managed Care – PPO | Admitting: Physical Therapy

## 2016-01-16 ENCOUNTER — Encounter: Payer: BC Managed Care – PPO | Admitting: Physical Therapy

## 2016-01-17 ENCOUNTER — Encounter: Payer: BC Managed Care – PPO | Admitting: Physical Therapy

## 2016-01-23 ENCOUNTER — Encounter: Payer: BC Managed Care – PPO | Admitting: Physical Therapy

## 2016-01-24 ENCOUNTER — Encounter: Payer: BC Managed Care – PPO | Admitting: Physical Therapy

## 2016-08-29 ENCOUNTER — Encounter: Payer: Self-pay | Admitting: Certified Nurse Midwife

## 2016-08-29 ENCOUNTER — Ambulatory Visit (INDEPENDENT_AMBULATORY_CARE_PROVIDER_SITE_OTHER): Payer: BC Managed Care – PPO | Admitting: Certified Nurse Midwife

## 2016-08-29 VITALS — BP 140/80 | HR 102 | Ht 69.0 in | Wt 214.0 lb

## 2016-08-29 DIAGNOSIS — Z1239 Encounter for other screening for malignant neoplasm of breast: Secondary | ICD-10-CM

## 2016-08-29 DIAGNOSIS — Z1231 Encounter for screening mammogram for malignant neoplasm of breast: Secondary | ICD-10-CM | POA: Diagnosis not present

## 2016-08-29 DIAGNOSIS — Z124 Encounter for screening for malignant neoplasm of cervix: Secondary | ICD-10-CM

## 2016-08-29 DIAGNOSIS — Z01419 Encounter for gynecological examination (general) (routine) without abnormal findings: Secondary | ICD-10-CM

## 2016-08-29 DIAGNOSIS — M069 Rheumatoid arthritis, unspecified: Secondary | ICD-10-CM | POA: Insufficient documentation

## 2016-08-29 NOTE — Progress Notes (Signed)
Gynecology Annual Exam  PCP: Lenard Simmer, MD  Chief Complaint:  Chief Complaint  Patient presents with  . Gynecologic Exam    History of Present Illness: Kerri Sullivan is a 46 y.o. G1P1001 who presents for her annual exam. The patient has no complaints today.  Her menses are regular, they occur every month, and they last 5 days. Her flow is variable, sometime light and sometimes heavy requiring a tampon change every 1-2 hours. She does not have intermenstrual bleeding. Her last menstrual period was 08/15/2016. She denies dysmenorrhea. Last pap smear: 02/02/2015, results were NIL/neg. Remote history of abnormal Pap smear with negative colposcopy/ biopsy   The patient is not currently sexually active. She currently uses abstinence for contraception.   Since her last visit, she has had no significant changes in her health. She did have a lipoma removed from the back of her next last year.  Her past medical history is remarkable for rheumatoid arthritis  The patient does perform self breast exams. Her last mammogram was 02/02/2015, results were negative.   There is a family history of breast cancer in her paternal aunt Genetic testing has not been done.   There is no family history of ovarian cancer.   The patient denies smoking.  She reports drinking an occasional beer.  She denies illegal drug use.  The patient reports exercising regularly. A DEXA scan in 2016 was normal. She does get adequate calcium in her diet.  She had a recent cholesterol panel in 2017 that was normal.  The patient denies current symptoms of depression.    Review of Systems: Review of Systems  Constitutional: Negative for chills, fever and weight loss.  HENT: Negative for congestion, sinus pain and sore throat.   Eyes: Negative for blurred vision and pain.  Respiratory: Negative for hemoptysis, shortness of breath and wheezing.   Cardiovascular: Negative for chest pain, palpitations and leg  swelling.  Gastrointestinal: Positive for constipation. Negative for abdominal pain, blood in stool, diarrhea, heartburn, nausea and vomiting.  Genitourinary: Negative for dysuria, frequency, hematuria and urgency.  Musculoskeletal: Positive for joint pain. Negative for back pain and myalgias.  Skin: Negative for itching and rash.  Neurological: Negative for dizziness, tingling and headaches.  Endo/Heme/Allergies: Negative for environmental allergies and polydipsia. Does not bruise/bleed easily.       Negative for hirsutism   Psychiatric/Behavioral: Negative for depression. The patient is not nervous/anxious and does not have insomnia.     Past Medical History:  Past Medical History:  Diagnosis Date  . Arthritis    rheumatoid arthritis-OFF METHOTREXATE SINCE 2016 AND DOING WELL PER PT (09-13-15)  . Asthma   . Family history of adverse reaction to anesthesia    MOM-N/V  . History of abnormal cervical Pap smear     Past Surgical History:  Past Surgical History:  Procedure Laterality Date  . CHOLECYSTECTOMY  1998  . FOOT SURGERY Left    repair of tendon  . [place]   right x2 and left x 1-arthroscopy  . LIPOMA EXCISION N/A 09/21/2015   Procedure: EXCISION LIPOMA BACK OF NECK;  Surgeon: Christene Lye, MD;  Location: [facility] ORS;  Service: General;  Laterality: N/A;    Family History:  Family History  Problem Relation Age of Onset  . Cancer Paternal Aunt 43       breast  . Cancer Cousin        colon  . Osteoporosis Mother   .  Heart disease Father   . Hypertension Father   . Diabetes Brother   . Alcoholism Brother   . Suicidality Brother   . Lung cancer Cousin        died late 36s    Social History:  Social History   Social History  . Marital status: Divorced    Spouse name: N/A  . Number of children: 1  . Years of education: 50   Occupational History  . Teacher    Social History Main Topics  . Smoking status: Never Smoker  . Smokeless tobacco:  Never Used  . Alcohol use Yes     Comment: RARE  . Drug use: No  . Sexual activity: Not Currently    Birth control/ protection: None   Other Topics Concern  . Not on file   Social History Narrative  . No narrative on file    Allergies:  No Known Allergies  Medications: Prior to Admission medications   Medication Sig Start Date End Date Taking? Authorizing Provider  Multiple Vitamin (MULTIVITAMIN) tablet Take 1 tablet by mouth daily.    [provider]  naproxen sodium (ANAPROX) 220 MG tablet Take 220 mg by mouth 2 (two) times daily as needed (inflammation).    [provider]  traMADol (ULTRAM) 50 MG tablet Take 1 tablet (50 mg total) by mouth every 6 (six) hours as needed. 09/21/15   Christene Lye, MD    Physical Exam Vitals: BP 140/80   Pulse (!) 102   Ht 5\' 9"  (1.753 m)   Wt 214 lb (97.1 kg)   LMP 08/15/2016 (Exact Date)   BMI 31.60 kg/m   General: NAD HEENT: normocephalic, anicteric Neck: no thyroid enlargement, no palpable nodules, no cervical lymphadenopathy  Pulmonary: No increased work of breathing, CTAB Cardiovascular: RRR, without murmur  Breast: Breast symmetrical, no tenderness, no palpable nodules or masses, no skin or nipple retraction present, no nipple discharge.  No axillary, infraclavicular or supraclavicular lymphadenopathy. Abdomen: Soft, non-tender, non-distended.  Umbilicus without lesions.  No hepatomegaly or masses palpable. No evidence of hernia. Genitourinary:  External: 1 cm epidermal/ inclusion cyst on the right labia minora  Normal urethral meatus, normal Bartholin's and Skene's glands.    Vagina: Normal vaginal mucosa, no evidence of prolapse.    Cervix: Grossly normal in appearance, no bleeding, non-tender  Uterus: Anteverted, normal size, shape, and consistency, mobile, and non-tender  Adnexa: No adnexal masses, non-tender  Rectal: deferred  Lymphatic: no evidence of inguinal lymphadenopathy Extremities: no  edema, erythema, or tenderness Neurologic: Grossly intact Psychiatric: mood appropriate, affect full     Assessment: 46 y.o. G1P1001 annual gyn exam Elevated blood pressure  Plan:   1) Breast cancer screening - recommend monthly self breast exam and annal mammograms. Mammogram was ordered today. Patient to schedule appointment at [facility]  2) Cervical cancer screening - Pap was done. ASCCP guidelines and rational discussed.  Patient opts for yearly screening interval  3) Colon cancer screening-discussed beginning colon cancer screening at age 59. Screening options discussed.  4) Routine healthcare maintenance including cholesterol and diabetes screening managed by PCP   5) RTO in 1 year for annual.  Dalia Heading, CNM

## 2016-09-03 LAB — IGP,RFX APTIMA HPV ALL PTH: PAP Smear Comment: 0

## 2019-04-10 ENCOUNTER — Ambulatory Visit: Payer: BC Managed Care – PPO

## 2019-04-11 ENCOUNTER — Ambulatory Visit: Payer: BC Managed Care – PPO | Attending: Internal Medicine

## 2019-04-11 DIAGNOSIS — Z23 Encounter for immunization: Secondary | ICD-10-CM

## 2019-04-11 NOTE — Progress Notes (Signed)
   Covid-19 Vaccination Clinic  Name:  JOEL YORK    MRN: [id number] DOB: Oct 25, 1970  04/11/2019  Ms. Sachdeva was observed post Covid-19 immunization for 15 minutes without incident. She was provided with Vaccine Information Sheet and instruction to access the V-Safe system.   Ms. Ratterree was instructed to call 911 with any severe reactions post vaccine: Marland Kitchen Difficulty breathing  . Swelling of face and throat  . A fast heartbeat  . A bad rash all over body  . Dizziness and weakness   Immunizations Administered    Name Date Dose VIS Date Route   Pfizer COVID-19 Vaccine 04/11/2019  8:55 AM 0.3 mL 01/15/2019 Intramuscular   Manufacturer: [place]   Lot: [id number]   [place]: [id number]

## 2019-05-04 ENCOUNTER — Ambulatory Visit: Payer: BC Managed Care – PPO | Attending: Internal Medicine

## 2019-05-04 DIAGNOSIS — Z23 Encounter for immunization: Secondary | ICD-10-CM

## 2019-05-04 NOTE — Progress Notes (Signed)
   Covid-19 Vaccination Clinic  Name:  Kerri Sullivan    MRN: [id number] DOB: May 13, 1970  05/04/2019  [name] was observed post Covid-19 immunization for 15 minutes without incident. She was provided with Vaccine Information Sheet and instruction to access the V-Safe system.   [name] was instructed to call 911 with any severe reactions post vaccine: Marland Kitchen Difficulty breathing  . Swelling of face and throat  . A fast heartbeat  . A bad rash all over body  . Dizziness and weakness   Immunizations Administered    Name Date Dose VIS Date Route   Pfizer COVID-19 Vaccine 05/04/2019  8:46 AM 0.3 mL 01/15/2019 Intramuscular   Manufacturer: [place]   Lot: [id number]   [place]: [id number]

## 2019-06-20 ENCOUNTER — Encounter: Payer: Self-pay | Admitting: Emergency Medicine

## 2019-06-20 ENCOUNTER — Emergency Department: Payer: BC Managed Care – PPO

## 2019-06-20 ENCOUNTER — Inpatient Hospital Stay
Admission: EM | Admit: 2019-06-20 | Discharge: 2019-07-04 | DRG: 372 | Disposition: A | Discharge status: Home / Self Care | Payer: BC Managed Care – PPO | Attending: General Surgery | Admitting: General Surgery

## 2019-06-20 ENCOUNTER — Other Ambulatory Visit: Payer: Self-pay

## 2019-06-20 DIAGNOSIS — M069 Rheumatoid arthritis, unspecified: Secondary | ICD-10-CM | POA: Diagnosis present

## 2019-06-20 DIAGNOSIS — Z20822 Contact with and (suspected) exposure to covid-19: Secondary | ICD-10-CM | POA: Diagnosis present

## 2019-06-20 DIAGNOSIS — Z833 Family history of diabetes mellitus: Secondary | ICD-10-CM | POA: Diagnosis not present

## 2019-06-20 DIAGNOSIS — K567 Ileus, unspecified: Secondary | ICD-10-CM | POA: Diagnosis present

## 2019-06-20 DIAGNOSIS — Z8249 Family history of ischemic heart disease and other diseases of the circulatory system: Secondary | ICD-10-CM | POA: Diagnosis not present

## 2019-06-20 DIAGNOSIS — K353 Acute appendicitis with localized peritonitis, without perforation or gangrene: Secondary | ICD-10-CM

## 2019-06-20 DIAGNOSIS — J45909 Unspecified asthma, uncomplicated: Secondary | ICD-10-CM | POA: Diagnosis present

## 2019-06-20 DIAGNOSIS — K3533 Acute appendicitis with perforation and localized peritonitis, with abscess: Secondary | ICD-10-CM | POA: Diagnosis present

## 2019-06-20 DIAGNOSIS — Z79899 Other long term (current) drug therapy: Secondary | ICD-10-CM

## 2019-06-20 LAB — URINALYSIS, COMPLETE (UACMP) WITH MICROSCOPIC
Bacteria, UA: NONE SEEN
Bilirubin Urine: NEGATIVE
Glucose, UA: NEGATIVE mg/dL
Hgb urine dipstick: NEGATIVE
Ketones, ur: 5 mg/dL — AB
Leukocytes,Ua: NEGATIVE
Nitrite: NEGATIVE
Protein, ur: NEGATIVE mg/dL
Specific Gravity, Urine: 1.025 (ref 1.005–1.030)
pH: 5 (ref 5.0–8.0)

## 2019-06-20 LAB — COMPREHENSIVE METABOLIC PANEL
ALT: 28 U/L (ref 0–44)
AST: 22 U/L (ref 15–41)
Albumin: 4.1 g/dL (ref 3.5–5.0)
Alkaline Phosphatase: 67 U/L (ref 38–126)
Anion gap: 9 (ref 5–15)
BUN: 12 mg/dL (ref 6–20)
CO2: 26 mmol/L (ref 22–32)
Calcium: 9 mg/dL (ref 8.9–10.3)
Chloride: 103 mmol/L (ref 98–111)
Creatinine, Ser: 0.77 mg/dL (ref 0.44–1.00)
GFR calc Af Amer: >60 mL/min (ref >60)
GFR calc non Af Amer: >60 mL/min (ref >60)
Glucose, Bld: 133 mg/dL — ABNORMAL HIGH (ref 70–99)
Potassium: 3.7 mmol/L (ref 3.5–5.1)
Sodium: 138 mmol/L (ref 135–145)
Total Bilirubin: 0.8 mg/dL (ref 0.3–1.2)
Total Protein: 7.5 g/dL (ref 6.5–8.1)

## 2019-06-20 LAB — CBC
HCT: 41.1 % (ref 36.0–46.0)
Hemoglobin: 14.2 g/dL (ref 12.0–15.0)
MCH: 29.2 pg (ref 26.0–34.0)
MCHC: 34.5 g/dL (ref 30.0–36.0)
MCV: 84.6 fL (ref 80.0–100.0)
Platelets: 259 10*3/uL (ref 150–400)
RBC: 4.86 MIL/uL (ref 3.87–5.11)
RDW: 13.9 % (ref 11.5–15.5)
WBC: 17.4 10*3/uL — ABNORMAL HIGH (ref 4.0–10.5)
nRBC: 0 % (ref 0.0–0.2)

## 2019-06-20 LAB — LIPASE, BLOOD: Lipase: 17 U/L (ref 11–51)

## 2019-06-20 LAB — POCT PREGNANCY, URINE: Preg Test, Ur: NEGATIVE

## 2019-06-20 LAB — SARS CORONAVIRUS 2 BY RT PCR (HOSPITAL ORDER, PERFORMED IN ~~LOC~~ HOSPITAL LAB): SARS Coronavirus 2: NEGATIVE

## 2019-06-20 MED ORDER — PIPERACILLIN-TAZOBACTAM 3.375 G IVPB
3.3750 g | Freq: Three times a day (TID) | INTRAVENOUS | Status: DC
  Administered 2019-06-20 – 2019-07-02 (×34): 3.375 g via INTRAVENOUS
  Filled 2019-06-20 (×36): qty 50

## 2019-06-20 MED ORDER — ENOXAPARIN SODIUM 40 MG/0.4ML ~~LOC~~ SOLN
40.0000 mg | SUBCUTANEOUS | Status: DC
  Administered 2019-06-20 – 2019-07-03 (×13): 40 mg via SUBCUTANEOUS
  Filled 2019-06-20 (×13): qty 0.4

## 2019-06-20 MED ORDER — ONDANSETRON 8 MG PO TBDP
8.0000 mg | ORAL_TABLET | Freq: Once | ORAL | Status: AC
  Administered 2019-06-20: 8 mg via ORAL
  Filled 2019-06-20: qty 1

## 2019-06-20 MED ORDER — ONDANSETRON 4 MG PO TBDP: 4.0000 mg | ORAL_TABLET | Freq: Four times a day (QID) | ORAL | Status: DC | PRN

## 2019-06-20 MED ORDER — MORPHINE SULFATE (PF) 4 MG/ML IV SOLN
4.0000 mg | Freq: Once | INTRAVENOUS | Status: AC
  Administered 2019-06-20: 4 mg via INTRAVENOUS
  Filled 2019-06-20: qty 1

## 2019-06-20 MED ORDER — ACETAMINOPHEN 650 MG RE SUPP: 650.0000 mg | Freq: Four times a day (QID) | RECTAL | Status: DC | PRN

## 2019-06-20 MED ORDER — SODIUM CHLORIDE 0.9 % IV BOLUS
1000.0000 mL | Freq: Once | INTRAVENOUS | Status: AC
  Administered 2019-06-20: 1000 mL via INTRAVENOUS

## 2019-06-20 MED ORDER — HYDROCODONE-ACETAMINOPHEN 5-325 MG PO TABS
1.0000 | ORAL_TABLET | ORAL | Status: DC | PRN
  Administered 2019-06-21 – 2019-07-04 (×63): 2 via ORAL
  Filled 2019-06-20 (×64): qty 2

## 2019-06-20 MED ORDER — PANTOPRAZOLE SODIUM 40 MG IV SOLR
40.0000 mg | Freq: Every day | INTRAVENOUS | Status: DC
  Administered 2019-06-20 – 2019-07-03 (×14): 40 mg via INTRAVENOUS
  Filled 2019-06-20 (×14): qty 40

## 2019-06-20 MED ORDER — SODIUM CHLORIDE 0.9 % IV SOLN: INTRAVENOUS | Status: DC

## 2019-06-20 MED ORDER — ONDANSETRON HCL 4 MG/2ML IJ SOLN
4.0000 mg | Freq: Four times a day (QID) | INTRAMUSCULAR | Status: DC | PRN
  Administered 2019-06-22 – 2019-06-26 (×2): 4 mg via INTRAVENOUS
  Filled 2019-06-20 (×2): qty 2

## 2019-06-20 MED ORDER — IOHEXOL 300 MG/ML  SOLN
100.0000 mL | Freq: Once | INTRAMUSCULAR | Status: AC | PRN
  Administered 2019-06-20: 100 mL via INTRAVENOUS

## 2019-06-20 MED ORDER — MORPHINE SULFATE (PF) 4 MG/ML IV SOLN
4.0000 mg | INTRAVENOUS | Status: DC | PRN
  Administered 2019-06-20 – 2019-06-27 (×21): 4 mg via INTRAVENOUS
  Filled 2019-06-20 (×22): qty 1

## 2019-06-20 MED ORDER — PIPERACILLIN-TAZOBACTAM 3.375 G IVPB 30 MIN
3.3750 g | Freq: Once | INTRAVENOUS | Status: AC
  Administered 2019-06-20: 3.375 g via INTRAVENOUS
  Filled 2019-06-20: qty 50

## 2019-06-20 MED ORDER — ACETAMINOPHEN 325 MG PO TABS
650.0000 mg | ORAL_TABLET | Freq: Four times a day (QID) | ORAL | Status: DC | PRN
  Administered 2019-07-01: 650 mg via ORAL
  Filled 2019-06-20: qty 2

## 2019-06-20 NOTE — ED Provider Notes (Signed)
[facility] Emergency Department Provider Note  ____________________________________________  Time seen: Approximately 3:09 PM  I have reviewed the triage vital signs and the nursing notes.   HISTORY  Chief Complaint Abdominal Pain    HPI Kerri Sullivan is a 49 y.o. female who presents the emergency department complaining of abdominal pain, nausea, chills x2 days.  Patient states that she felt "bloated" 2 days ago.  Patient states that the burning sensation has increased to frank pain.  She states that pain and bloating sensation started on the right and is now localized to the right lower quadrant with severe pain.  Anorexia today.  Patient denies any diarrhea.  No URI symptoms.  No chest pain or shortness of breath.  Patient denies any vaginal bleeding or discharge.  Patient has had a cholecystectomy but still has her appendix.  No medications prior to arrival.         Past Medical History:  Diagnosis Date  . Arthritis    rheumatoid arthritis-OFF METHOTREXATE SINCE 2016 AND DOING WELL PER PT (09-13-15)  . Asthma   . Family history of adverse reaction to anesthesia    MOM-N/V  . History of abnormal cervical Pap smear     Patient Active Problem List   Diagnosis Date Noted  . Rheumatoid arthritis ([place]) 08/29/2016  . Masses of both breasts 11/16/2013    Past Surgical History:  Procedure Laterality Date  . CHOLECYSTECTOMY  1998  . FOOT SURGERY Left    repair of tendon  . [place]   right x2 and left x 1-arthroscopy  . LIPOMA EXCISION N/A 09/21/2015   Procedure: EXCISION LIPOMA BACK OF NECK;  Surgeon: Christene Lye, MD;  Location: [facility] ORS;  Service: General;  Laterality: N/A;    Prior to Admission medications   Medication Sig Start Date End Date Taking? Authorizing Provider  naproxen sodium (ANAPROX) 220 MG tablet Take 220 mg by mouth 2 (two) times daily as needed (inflammation).    [provider]    Allergies Patient  has no known allergies.  Family History  Problem Relation Age of Onset  . Cancer Paternal Aunt 31       breast  . Cancer Cousin        colon  . Osteoporosis Mother   . Heart disease Father   . Hypertension Father   . Diabetes Brother   . Alcoholism Brother   . Suicidality Brother   . Lung cancer Cousin        died late 23s    Social History Social History   Tobacco Use  . Smoking status: Never Smoker  . Smokeless tobacco: Never Used  Substance Use Topics  . Alcohol use: Yes    Comment: RARE  . Drug use: No     Review of Systems  Constitutional: No fever/chills Eyes: No visual changes. No discharge ENT: No upper respiratory complaints. Cardiovascular: no chest pain. Respiratory: no cough. No SOB. Gastrointestinal: Right lower quadrant abdominal pain.  Anorexia today.  Positive nausea, no vomiting.  No diarrhea.  No constipation. Genitourinary: Negative for dysuria. No hematuria Musculoskeletal: Negative for musculoskeletal pain. Skin: Negative for rash, abrasions, lacerations, ecchymosis. Neurological: Negative for headaches, focal weakness or numbness. 10-point ROS otherwise negative.  ____________________________________________   PHYSICAL EXAM:  VITAL SIGNS: ED Triage Vitals  Enc Vitals Group     BP 06/20/19 1314 125/70     Pulse Rate 06/20/19 1314 (!) 107     Resp 06/20/19  1314 16     Temp 06/20/19 1314 99.5 F (37.5 C)     Temp Source 06/20/19 1314 Oral     SpO2 06/20/19 1314 96 %     Weight 06/20/19 1321 205 lb (93 kg)     Height 06/20/19 1321 5\' 10"  (1.778 m)     Head Circumference --      Peak Flow --      Pain Score 06/20/19 1321 8     Pain Loc --      Pain Edu? --      Excl. in [place]? --      Constitutional: Alert and oriented. Well appearing and in no acute distress. Eyes: Conjunctivae are normal. PERRL. EOMI. Head: Atraumatic. ENT:      Ears:       Nose: No congestion/rhinnorhea.      Mouth/Throat: Mucous membranes are moist.  Neck:  No stridor.    Cardiovascular: Normal rate, regular rhythm. Normal S1 and S2.  Good peripheral circulation. Respiratory: Normal respiratory effort without tachypnea or retractions. Lungs CTAB. Good air entry to the bases with no decreased or absent breath sounds. Gastrointestinal: No external abdominal findings.  Bowel sounds 4 quadrants. Soft to palpation all quadrants.  Tender to palpation of the right lower quadrant with rebound tenderness.  Guarding right lower quadrant but no rigidity.. No palpable masses. No distention. No CVA tenderness. Musculoskeletal: Full range of motion to all extremities. No gross deformities appreciated. Neurologic:  Normal speech and language. No gross focal neurologic deficits are appreciated.  Skin:  Skin is warm, dry and intact. No rash noted. Psychiatric: Mood and affect are normal. Speech and behavior are normal. Patient exhibits appropriate insight and judgement.   ____________________________________________   LABS (all labs ordered are listed, but only abnormal results are displayed)  Labs Reviewed  COMPREHENSIVE METABOLIC PANEL - Abnormal; Notable for the following components:      Result Value   Glucose, Bld 133 (*)    All other components within normal limits  CBC - Abnormal; Notable for the following components:   WBC 17.4 (*)    All other components within normal limits  URINALYSIS, COMPLETE (UACMP) WITH MICROSCOPIC - Abnormal; Notable for the following components:   Color, Urine YELLOW (*)    APPearance HAZY (*)    Ketones, ur 5 (*)    All other components within normal limits  SARS CORONAVIRUS 2 BY RT PCR (HOSPITAL ORDER, [place] [facility])  LIPASE, BLOOD  POC URINE PREG, ED  POCT PREGNANCY, URINE   ____________________________________________  EKG   ____________________________________________  RADIOLOGY I personally viewed and evaluated these images as part of my medical decision making, as well as  reviewing the written report by the radiologist.  CT ABDOMEN PELVIS W CONTRAST  Result Date: 06/20/2019 CLINICAL DATA:  Stabbing pain in stomach EXAM: CT ABDOMEN AND PELVIS WITH CONTRAST TECHNIQUE: Multidetector CT imaging of the abdomen and pelvis was performed using the standard protocol following bolus administration of intravenous contrast. CONTRAST:  136mL OMNIPAQUE IOHEXOL 300 MG/ML  SOLN COMPARISON:  None. FINDINGS: Lower chest: Lung bases are clear. Normal heart size. No pericardial effusion. Hepatobiliary: No focal liver abnormality is seen. Patient is post cholecystectomy. Slight prominence of the biliary tree likely related to reservoir effect. No calcified intraductal gallstones. Pancreas: Unremarkable. No pancreatic ductal dilatation or surrounding inflammatory changes. Spleen: Normal in size without focal abnormality. Adrenals/Urinary Tract: Normal adrenals. Kidneys are normally located with symmetric enhancement and excretion. No suspicious  renal lesion, urolithiasis or hydronephrosis. Urinary bladder is largely decompressed at the time of exam and therefore poorly evaluated by CT imaging. No visible bladder abnormality. Stomach/Bowel: There is a fluid-filled, thickened and hyperemic appendix containing at least 3 small fecalith measuring up to 7 mm in size extensive periappendiceal phlegmon and fat stranding as well as reactive thickening of the cecum and terminal ileum. No extraluminal gas. Trace fluid in the pericolic gutters favored to be reactive. Distal esophagus, stomach and duodenal sweep are unremarkable. No proximal small bowel wall thickening or dilatation. No other colonic dilatation or wall thickening. No evidence of obstruction. Vascular/Lymphatic: Atherosclerotic calcifications throughout the abdominal aorta and branch vessels. No aneurysm or ectasia. No enlarged abdominopelvic lymph nodes. Few reactive nodes in the right lower quadrant. Reproductive: Normal appearance of the uterus  and adnexal structures. Radiolucent tampon within the vaginal canal. Other: Phlegmon and trace reactive fluid in the right pericolic gutter adjacent the appendix, as above. No free air. No bowel containing hernias Musculoskeletal: Mild straightening of the normal spinal curvature. Discogenic change L5-S1 with small global disc bulge and facet arthropathy. No acute osseous abnormality or suspicious osseous lesion. IMPRESSION: Acute, uncomplicated appendicitis with extensive periappendiceal phlegmon and fat stranding as well as reactive thickening of the cecum and terminal ileum. No evidence of perforation or visible abscess formation. Electronically Signed   By: Lovena Le M.D.   On: 06/20/2019 14:54    ____________________________________________    PROCEDURES  Procedure(s) performed:    Procedures    Medications  iohexol (OMNIPAQUE) 300 MG/ML solution 100 mL (100 mLs Intravenous Contrast Given 06/20/19 1437)  sodium chloride 0.9 % bolus 1,000 mL (1,000 mLs Intravenous New Bag/Given 06/20/19 1534)  piperacillin-tazobactam (ZOSYN) IVPB 3.375 g (3.375 g Intravenous New Bag/Given 06/20/19 1543)  ondansetron (ZOFRAN-ODT) disintegrating tablet 8 mg (8 mg Oral Given 06/20/19 1536)  morphine 4 MG/ML injection 4 mg (4 mg Intravenous Given 06/20/19 1535)     ____________________________________________   INITIAL IMPRESSION / ASSESSMENT AND PLAN / ED COURSE  Pertinent labs & imaging results that were available during my care of the patient were reviewed by me and considered in my medical decision making (see chart for details).  Review of the [place] CSRS was performed in accordance of the [place] prior to dispensing any controlled drugs.           Patient's diagnosis is consistent with appendicitis.  Patient presented to emergency department with 2 days of abdominal discomfort leading into significant right lower quadrant abdominal pain.  Differential included UTI, pyelonephritis, nephrolithiasis,  appendicitis, bowel obstruction, ovarian cyst, ovarian torsion.  Findings on exam, labs were concerning for appendicitis and CT reveals nonperforated appendicitis.  Surgery consulted.  Patient is given fluids, antibiotics prior to surgery..     ____________________________________________  FINAL CLINICAL IMPRESSION(S) / ED DIAGNOSES  Final diagnoses:  Acute appendicitis with localized peritonitis, without perforation, abscess, or gangrene      NEW MEDICATIONS STARTED DURING THIS VISIT:  ED Discharge Orders    None          This chart was dictated using voice recognition software/Dragon. Despite best efforts to proofread, errors can occur which can change the meaning. Any change was purely unintentional.    Darletta Moll, PA-C 06/20/19 1620    Drenda Freeze, MD 06/20/19 [phone]

## 2019-06-20 NOTE — ED Notes (Signed)
D/w Dr. Jari Pigg pt having referred pain to R side when pushing on L. Suspect appendicitis.

## 2019-06-20 NOTE — H&P (Signed)
History of Present Illness Kerri Sullivan is a 49 y.o. female with abdominal pain since 3 days ago.  She reported that she initially started feeling bloated.  Then started with abdominal pain on the periumbilical area.  The pain then radiated to the right lower quadrant.  Aggravating factor has been movement of the abdomen.  There has been no alleviating factor.  She denies fever or chills.  She report associated nausea.  At the ED she had CT scan that shows appendicitis with associated phlegmon and severe inflammation of the cecum and terminal ileum.  She was also found with leukocytosis of 17,000.  There is no significant electrolyte disturbance or acidosis.  Patient consented to surgery by Dr. Darl Householder due to appendicitis.  Past Medical History Past Medical History:  Diagnosis Date  . Arthritis    rheumatoid arthritis-OFF METHOTREXATE SINCE 2016 AND DOING WELL PER PT (09-13-15)  . Asthma   . Family history of adverse reaction to anesthesia    MOM-N/V  . History of abnormal cervical Pap smear        Past Surgical History:  Procedure Laterality Date  . CHOLECYSTECTOMY  1998  . FOOT SURGERY Left    repair of tendon  . [place]   right x2 and left x 1-arthroscopy  . LIPOMA EXCISION N/A 09/21/2015   Procedure: EXCISION LIPOMA BACK OF NECK;  Surgeon: Christene Lye, MD;  Location: [facility] ORS;  Service: General;  Laterality: N/A;    No Known Allergies  Current Facility-Administered Medications  Medication Dose Route Frequency Provider Last Rate Last Admin  . 0.9 %  sodium chloride infusion   Intravenous Continuous Herbert Pun, MD      . acetaminophen (TYLENOL) tablet 650 mg  650 mg Oral Q6H PRN Herbert Pun, MD       Or  . acetaminophen (TYLENOL) suppository 650 mg  650 mg Rectal Q6H PRN Herbert Pun, MD      . enoxaparin (LOVENOX) injection 40 mg  40 mg Subcutaneous Q24H Cintron-Diaz, Reeves Forth, MD      . HYDROcodone-acetaminophen (NORCO/VICODIN)  5-325 MG per tablet 1-2 tablet  1-2 tablet Oral Q4H PRN Herbert Pun, MD      . morphine 4 MG/ML injection 4 mg  4 mg Intravenous Q4H PRN Herbert Pun, MD      . ondansetron (ZOFRAN-ODT) disintegrating tablet 4 mg  4 mg Oral Q6H PRN Herbert Pun, MD       Or  . ondansetron (ZOFRAN) injection 4 mg  4 mg Intravenous Q6H PRN Herbert Pun, MD      . pantoprazole (PROTONIX) injection 40 mg  40 mg Intravenous QHS Herbert Pun, MD      . piperacillin-tazobactam (ZOSYN) IVPB 3.375 g  3.375 g Intravenous Q8H Herbert Pun, MD       Current Outpatient Medications  Medication Sig Dispense Refill  . fexofenadine (ALLEGRA) 180 MG tablet Take 180 mg by mouth daily.    . Magnesium 250 MG TABS Take 250 mg by mouth daily.    . Multiple Vitamin (MULTIVITAMIN WITH MINERALS) TABS tablet Take 1 tablet by mouth daily.    . Omega-3 Fatty Acids (FISH OIL) 1200 MG CAPS Take 1,200 mg by mouth daily.    . polycarbophil (FIBERCON) 625 MG tablet Take 625 mg by mouth daily.    . TURMERIC PO Take 1 tablet by mouth daily.    . vitamin B-12 (CYANOCOBALAMIN) 100 MCG tablet Take 100 mcg by mouth daily.  Family History Family History  Problem Relation Age of Onset  . Cancer Paternal Aunt 46       breast  . Cancer Cousin        colon  . Osteoporosis Mother   . Heart disease Father   . Hypertension Father   . Diabetes Brother   . Alcoholism Brother   . Suicidality Brother   . Lung cancer Cousin        died late 29s       Social History Social History   Tobacco Use  . Smoking status: Never Smoker  . Smokeless tobacco: Never Used  Substance Use Topics  . Alcohol use: Yes    Comment: RARE  . Drug use: No        ROS Full ROS of systems performed and is otherwise negative there than what is stated in the HPI  Physical Exam Blood pressure (!) 108/59, pulse 97, temperature 99.5 F (37.5 C), temperature source Oral, resp. rate 16, height 5\' 10"   (1.778 m), weight 93 kg, last menstrual period 06/18/2019, SpO2 95 %.  CONSTITUTIONAL: No distress, alert and oriented x3 EYES: Pupils equal, round, and reactive to light, Sclera non-icteric. EARS, NOSE, MOUTH AND THROAT: The oropharynx is clear. Oral mucosa is pink and moist. Hearing is intact to voice.  NECK: Trachea is midline, and there is no jugular venous distension. Thyroid is without palpable abnormalities. LYMPH NODES:  Lymph nodes in the neck are not enlarged. RESPIRATORY:  Lungs are clear, and breath sounds are equal bilaterally. Normal respiratory effort without pathologic use of accessory muscles. CARDIOVASCULAR: Heart is regular without murmurs, gallops, or rubs. GI: The abdomen is  soft, moderate tender in right lower quadrant, and nondistended. There were no palpable masses. There was no hepatosplenomegaly. There were normal bowel sounds. GU: Deferred MUSCULOSKELETAL:  Normal muscle strength and tone in all four extremities.    SKIN: Skin turgor is normal. There are no pathologic skin lesions.  NEUROLOGIC:  Motor and sensation is grossly normal.  Cranial nerves are grossly intact. PSYCH:  Alert and oriented to person, place and time. Affect is normal.  Data Reviewed CBC Latest Ref Rng & Units 06/20/2019  WBC 4.0 - 10.5 K/uL 17.4(H)  Hemoglobin 12.0 - 15.0 g/dL 14.2  Hematocrit 36.0 - 46.0 % 41.1  Platelets 150 - 400 K/uL 259   CMP Latest Ref Rng & Units 06/20/2019  Glucose 70 - 99 mg/dL 133(H)  BUN 6 - 20 mg/dL 12  Creatinine 0.44 - 1.00 mg/dL 0.77  Sodium 135 - 145 mmol/L 138  Potassium 3.5 - 5.1 mmol/L 3.7  Chloride 98 - 111 mmol/L 103  CO2 22 - 32 mmol/L 26  Calcium 8.9 - 10.3 mg/dL 9.0  Total Protein 6.5 - 8.1 g/dL 7.5  Total Bilirubin 0.3 - 1.2 mg/dL 0.8  Alkaline Phos 38 - 126 U/L 67  AST 15 - 41 U/L 22  ALT 0 - 44 U/L 28     I have personally reviewed the patient's imaging and medical records.    Assessment and plan    49 year old patient with acute  appendicitis with phlegmonous process and severe inflammation of the cecum and terminal ileum.  I discussed with the patient is findings.  At The patient that in her case her best initial management is treating the appendicitis with antibiotic therapy due to the amount of phlegmonous process and the inflammation of the cecum and the terminal ileum.  I have entered the patient that due to  this finding it is very risky to proceed with surgery that will most likely ended up in a partial colectomy and open surgeries.  The plan is to start IV antibiotic therapy, bowel rest and pain management with IV fluid.  If patient reported adequately we will continue with this therapy.  Patient oriented about the possibility of forming an abscess and needing percutaneous drainage.  Face-to-face time spent with the patient and care providers was 70 minutes, with more than 50% of the time spent counseling, educating, and coordinating care of the patient.     Quashawn Jewkes Cintron-Diaz 06/20/2019, 8:09 PM

## 2019-06-20 NOTE — ED Triage Notes (Signed)
Pt arrived via POV with reports of "extreme stabbing pain in stomach" Pt states sxs started Friday and worse over the past 2 days, Pt states the pain started in upper abdomen and moved to RLQ.  Pt c/o feeling nauseated as well. Denies any vomtiing or diarrhea.  Pt states the pain is worse when moving.  Pt had GB removed in 1998.  Denies any dysuria.

## 2019-06-21 LAB — BASIC METABOLIC PANEL
Anion gap: 10 (ref 5–15)
BUN: 14 mg/dL (ref 6–20)
CO2: 24 mmol/L (ref 22–32)
Calcium: 8.1 mg/dL — ABNORMAL LOW (ref 8.9–10.3)
Chloride: 103 mmol/L (ref 98–111)
Creatinine, Ser: 0.77 mg/dL (ref 0.44–1.00)
GFR calc Af Amer: >60 mL/min (ref >60)
GFR calc non Af Amer: >60 mL/min (ref >60)
Glucose, Bld: 133 mg/dL — ABNORMAL HIGH (ref 70–99)
Potassium: 3.4 mmol/L — ABNORMAL LOW (ref 3.5–5.1)
Sodium: 137 mmol/L (ref 135–145)

## 2019-06-21 LAB — CBC
HCT: 35.8 % — ABNORMAL LOW (ref 36.0–46.0)
Hemoglobin: 12.4 g/dL (ref 12.0–15.0)
MCH: 29.2 pg (ref 26.0–34.0)
MCHC: 34.6 g/dL (ref 30.0–36.0)
MCV: 84.4 fL (ref 80.0–100.0)
Platelets: 214 10*3/uL (ref 150–400)
RBC: 4.24 MIL/uL (ref 3.87–5.11)
RDW: 14.1 % (ref 11.5–15.5)
WBC: 16.9 10*3/uL — ABNORMAL HIGH (ref 4.0–10.5)
nRBC: 0 % (ref 0.0–0.2)

## 2019-06-21 LAB — HIV ANTIBODY (ROUTINE TESTING W REFLEX): HIV Screen 4th Generation wRfx: NONREACTIVE

## 2019-06-21 MED ORDER — POTASSIUM CHLORIDE CRYS ER 20 MEQ PO TBCR
20.0000 meq | EXTENDED_RELEASE_TABLET | Freq: Once | ORAL | Status: AC
  Administered 2019-06-21: 20 meq via ORAL
  Filled 2019-06-21: qty 1

## 2019-06-21 NOTE — ED Notes (Signed)
Pt lying in bed asleep at this time. Nothing needed from staff

## 2019-06-21 NOTE — ED Notes (Signed)
Pt up to restroom without difficulty at this time

## 2019-06-21 NOTE — Progress Notes (Signed)
[name] Day(s): 1.   Post op day(s):  Marland Kitchen   Interval History: Patient seen and examined, no acute events or new complaints overnight. Patient reports continued having persistent pain.  Pain is aggravated by moving the abdomen.  Pain is alleviated by staying still.  Pain now localized in the right lower quadrant.  Pain does not radiate to the part of the body.  He denies nausea or vomiting.  Denies fever.  Vital signs in last 24 hours: [min-max] current  Temp:  [98.4 F (36.9 C)-98.9 F (37.2 C)] 98.4 F (36.9 C) (05/17 1000) Pulse Rate:  [78-97] 84 (05/17 1000) Resp:  [14-17] 17 (05/17 1000) BP: (93-114)/(55-74) 114/74 (05/17 1000) SpO2:  [95 %-96 %] 95 % (05/17 1000)     Height: 5\' 10"  (177.8 cm) Weight: 93 kg BMI (Calculated): 29.41   Physical Exam:  Constitutional: alert, cooperative and no distress  Respiratory: breathing non-labored at rest  Cardiovascular: regular rate and sinus rhythm  Gastrointestinal: soft, moderate-tender, and non-distended  Labs:  CBC Latest Ref Rng & Units 06/21/2019 06/20/2019  WBC 4.0 - 10.5 K/uL 16.9(H) 17.4(H)  Hemoglobin 12.0 - 15.0 g/dL 12.4 14.2  Hematocrit 36.0 - 46.0 % 35.8(L) 41.1  Platelets 150 - 400 K/uL 214 259   CMP Latest Ref Rng & Units 06/21/2019 06/20/2019  Glucose 70 - 99 mg/dL 133(H) 133(H)  BUN 6 - 20 mg/dL 14 12  Creatinine 0.44 - 1.00 mg/dL 0.77 0.77  Sodium 135 - 145 mmol/L 137 138  Potassium 3.5 - 5.1 mmol/L 3.4(L) 3.7  Chloride 98 - 111 mmol/L 103 103  CO2 22 - 32 mmol/L 24 26  Calcium 8.9 - 10.3 mg/dL 8.1(L) 9.0  Total Protein 6.5 - 8.1 g/dL - 7.5  Total Bilirubin 0.3 - 1.2 mg/dL - 0.8  Alkaline Phos 38 - 126 U/L - 67  AST 15 - 41 U/L - 22  ALT 0 - 44 U/L - 28    Imaging studies: No new pertinent imaging studies   Assessment/Plan:  49 y.o. female with acute appendicitis with phlegmon.  Patient without clinical deterioration.  There is minimal improvement of white blood cell count.   There has been no fever.  No tachycardia.  We will continue with current management with IV antibiotic therapy.  We will start patient on clear liquids and assess for toleration.  Plan is to repeat CT scanning day 4 or 5 from admission assessed if there is any abscess formation from the phlegmon.  Patient encouraged to ambulate.  We will continue with DVT prophylaxis.  [name], MD

## 2019-06-22 LAB — GLUCOSE, CAPILLARY: Glucose-Capillary: 92 mg/dL (ref 70–99)

## 2019-06-22 NOTE — Progress Notes (Signed)
[facility] Hospital Day(s): 2.   Post op day(s):  Marland Kitchen   Interval History: Patient seen and examined, no acute events or new complaints overnight. Patient reports feeling the same.  She reported that if she does not move she feels more comfortable but if she moves the pain returns.  She also collided with more bloated than yesterday.  Denies nausea or vomiting.  Denies fever or chills.  Vital signs in last 24 hours: [min-max] current  Temp:  [97.7 F (36.5 C)-100.6 F (38.1 C)] 97.7 F (36.5 C) (05/18 1208) Pulse Rate:  [84-101] 94 (05/18 1208) Resp:  [14-19] 16 (05/18 1208) BP: (104-134)/(62-77) 134/76 (05/18 1208) SpO2:  [91 %-99 %] 99 % (05/18 1208)     Height: 5\' 10"  (177.8 cm) Weight: 93 kg BMI (Calculated): 29.41   Physical Exam:  Constitutional: alert, cooperative and no distress  Respiratory: breathing non-labored at rest  Cardiovascular: regular rate and sinus rhythm  Gastrointestinal: soft, moderate-tender, and non-distended  Labs:  CBC Latest Ref Rng & Units 06/21/2019 06/20/2019  WBC 4.0 - 10.5 K/uL 16.9(H) 17.4(H)  Hemoglobin 12.0 - 15.0 g/dL 12.4 14.2  Hematocrit 36.0 - 46.0 % 35.8(L) 41.1  Platelets 150 - 400 K/uL 214 259   CMP Latest Ref Rng & Units 06/21/2019 06/20/2019  Glucose 70 - 99 mg/dL 133(H) 133(H)  BUN 6 - 20 mg/dL 14 12  Creatinine 0.44 - 1.00 mg/dL 0.77 0.77  Sodium 135 - 145 mmol/L 137 138  Potassium 3.5 - 5.1 mmol/L 3.4(L) 3.7  Chloride 98 - 111 mmol/L 103 103  CO2 22 - 32 mmol/L 24 26  Calcium 8.9 - 10.3 mg/dL 8.1(L) 9.0  Total Protein 6.5 - 8.1 g/dL - 7.5  Total Bilirubin 0.3 - 1.2 mg/dL - 0.8  Alkaline Phos 38 - 126 U/L - 67  AST 15 - 41 U/L - 22  ALT 0 - 44 U/L - 28    Imaging studies: No new pertinent imaging studies   Assessment/Plan:  49 y.o. female with acute appendicitis with phlegmon.  Patient without clinical deterioration.  Still with persistent pain in the right lower quadrant.  There has been no significant  fever or tachycardia.  Antibiotic currently controlling sepsis.  We will repeat labs in the morning.  We will continue to try medical management of antibiotic therapy for appendicitis with phlegmon.  We will need to repeat CT scan during admission to assess the formation of abscess and possible drainage if present.  Encourage the patient to ambulate.  We will continue with DVT prophylaxis.  [name], MD

## 2019-06-23 LAB — BASIC METABOLIC PANEL
Anion gap: 9 (ref 5–15)
BUN: 7 mg/dL (ref 6–20)
CO2: 23 mmol/L (ref 22–32)
Calcium: 8 mg/dL — ABNORMAL LOW (ref 8.9–10.3)
Chloride: 104 mmol/L (ref 98–111)
Creatinine, Ser: 0.55 mg/dL (ref 0.44–1.00)
GFR calc Af Amer: >60 mL/min (ref >60)
GFR calc non Af Amer: >60 mL/min (ref >60)
Glucose, Bld: 100 mg/dL — ABNORMAL HIGH (ref 70–99)
Potassium: 3.6 mmol/L (ref 3.5–5.1)
Sodium: 136 mmol/L (ref 135–145)

## 2019-06-23 LAB — CBC
HCT: 38.2 % (ref 36.0–46.0)
Hemoglobin: 12.7 g/dL (ref 12.0–15.0)
MCH: 29.1 pg (ref 26.0–34.0)
MCHC: 33.2 g/dL (ref 30.0–36.0)
MCV: 87.4 fL (ref 80.0–100.0)
Platelets: 259 10*3/uL (ref 150–400)
RBC: 4.37 MIL/uL (ref 3.87–5.11)
RDW: 13.8 % (ref 11.5–15.5)
WBC: 13.2 10*3/uL — ABNORMAL HIGH (ref 4.0–10.5)
nRBC: 0 % (ref 0.0–0.2)

## 2019-06-23 NOTE — Progress Notes (Signed)
[place] Hospital Day(s): 3.   Post op day(s):  Marland Kitchen   Interval History: Patient seen and examined, no acute events or new complaints overnight. Patient reports feeling adequate better.  She still reports significant bloating.  She reported that she has pain on the right lower quadrant.  Pain is controlled with current pain medication.  She reports that she feels okay as long as she has pain medication in her system.  Denies nausea or vomiting.  Denies fever or chills.  Vital signs in last 24 hours: [min-max] current  Temp:  [98.4 F (36.9 C)-99.3 F (37.4 C)] 99.3 F (37.4 C) (05/19 1439) Pulse Rate:  [85-94] 91 (05/19 1439) Resp:  [18] 18 (05/19 1439) BP: (113-122)/(62-71) 113/62 (05/19 1439) SpO2:  [97 %-99 %] 97 % (05/19 1439)     Height: 5\' 10"  (177.8 cm) Weight: 93 kg BMI (Calculated): 29.41   Physical Exam:  Constitutional: alert, cooperative and no distress  Respiratory: breathing non-labored at rest  Cardiovascular: regular rate and sinus rhythm  Gastrointestinal: soft, moderate-tender, and non-distended  Labs:  CBC Latest Ref Rng & Units 06/23/2019 06/21/2019 06/20/2019  WBC 4.0 - 10.5 K/uL 13.2(H) 16.9(H) 17.4(H)  Hemoglobin 12.0 - 15.0 g/dL 12.7 12.4 14.2  Hematocrit 36.0 - 46.0 % 38.2 35.8(L) 41.1  Platelets 150 - 400 K/uL 259 214 259   CMP Latest Ref Rng & Units 06/23/2019 06/21/2019 06/20/2019  Glucose 70 - 99 mg/dL 100(H) 133(H) 133(H)  BUN 6 - 20 mg/dL 7 14 12   Creatinine 0.44 - 1.00 mg/dL 0.55 0.77 0.77  Sodium 135 - 145 mmol/L 136 137 138  Potassium 3.5 - 5.1 mmol/L 3.6 3.4(L) 3.7  Chloride 98 - 111 mmol/L 104 103 103  CO2 22 - 32 mmol/L 23 24 26   Calcium 8.9 - 10.3 mg/dL 8.0(L) 8.1(L) 9.0  Total Protein 6.5 - 8.1 g/dL - - 7.5  Total Bilirubin 0.3 - 1.2 mg/dL - - 0.8  Alkaline Phos 38 - 126 U/L - - 67  AST 15 - 41 U/L - - 22  ALT 0 - 44 U/L - - 28    Imaging studies: No new pertinent imaging studies   Assessment/Plan:  49  y.o.femalewith acute appendicitis with phlegmon.  Patient with improved white blood cell count.  Still with unchanged physical exam.  No peritonitis.  We will continue with IV antibiotic therapy.  We will continue with clear liquid diet.  Will repeat CT scan tomorrow to assess any change in the phlegmon.  Patient ambulating.  We will continue with DVT prophylaxis.  [name], MD

## 2019-06-24 ENCOUNTER — Inpatient Hospital Stay: Payer: Self-pay

## 2019-06-24 ENCOUNTER — Inpatient Hospital Stay: Payer: BC Managed Care – PPO

## 2019-06-24 ENCOUNTER — Encounter: Payer: Self-pay | Admitting: General Surgery

## 2019-06-24 MED ORDER — IOHEXOL 300 MG/ML  SOLN
100.0000 mL | Freq: Once | INTRAMUSCULAR | Status: AC | PRN
  Administered 2019-06-24: 100 mL via INTRAVENOUS

## 2019-06-24 MED ORDER — IOHEXOL 9 MG/ML PO SOLN
500.0000 mL | ORAL | Status: AC
  Administered 2019-06-24 (×2): 500 mL via ORAL

## 2019-06-24 NOTE — Progress Notes (Signed)
[facility] Hospital Day(s): 4.   Post op day(s):  Marland Kitchen   Interval History: Patient seen and examined, no acute events or new complaints overnight. Patient reports feeling a little bit better. She continue feeling bloated and with abdominal tightness. Denies vomiting. No pain radiation. No alleviating or aggravating factors.   Vital signs in last 24 hours: [min-max] current  Temp:  [98.9 F (37.2 C)-99 F (37.2 C)] 99 F (37.2 C) (05/20 1357) Pulse Rate:  [86-93] 86 (05/20 1357) Resp:  [16-18] 18 (05/20 1357) BP: (117-121)/(69-75) 121/69 (05/20 1357) SpO2:  [94 %-96 %] 94 % (05/20 1357)     Height: 5\' 10"  (177.8 cm) Weight: 93 kg BMI (Calculated): 29.41   Physical Exam:  Constitutional: alert, cooperative and no distress  Respiratory: breathing non-labored at rest  Cardiovascular: regular rate and sinus rhythm  Gastrointestinal: soft, moderate tender, and distended  Labs:  CBC Latest Ref Rng & Units 06/23/2019 06/21/2019 06/20/2019  WBC 4.0 - 10.5 K/uL 13.2(H) 16.9(H) 17.4(H)  Hemoglobin 12.0 - 15.0 g/dL 12.7 12.4 14.2  Hematocrit 36.0 - 46.0 % 38.2 35.8(L) 41.1  Platelets 150 - 400 K/uL 259 214 259   CMP Latest Ref Rng & Units 06/23/2019 06/21/2019 06/20/2019  Glucose 70 - 99 mg/dL 100(H) 133(H) 133(H)  BUN 6 - 20 mg/dL 7 14 12   Creatinine 0.44 - 1.00 mg/dL 0.55 0.77 0.77  Sodium 135 - 145 mmol/L 136 137 138  Potassium 3.5 - 5.1 mmol/L 3.6 3.4(L) 3.7  Chloride 98 - 111 mmol/L 104 103 103  CO2 22 - 32 mmol/L 23 24 26   Calcium 8.9 - 10.3 mg/dL 8.0(L) 8.1(L) 9.0  Total Protein 6.5 - 8.1 g/dL - - 7.5  Total Bilirubin 0.3 - 1.2 mg/dL - - 0.8  Alkaline Phos 38 - 126 U/L - - 67  AST 15 - 41 U/L - - 22  ALT 0 - 44 U/L - - 28    Imaging studies: I perosonally evaluated the CT scan and discussed with IR. No drainable fluid collection. New small bowel distention. No free air. Significant pelvic fluid.    Assessment/Plan:  49 y.o.femalewith acute appendicitis with  phlegmon.  Patient not improving or deteriorating clinically. Continue with tenderness mostly on the right lower quadrant with bloating. No vomiting. Passing gas and bowel movement after CT scan. I showed the patient the images and compared with previous CT scan. I discuss the alternative to continue conservative management vs surgical management. Conservative management will include, IV abx therapy, clear liquids, TPN due to prolonged ileus. Surgical management will means at least right colectomy.   Since she is not clinically deteriorating with improved WBC count, no fever or tachycardia, it is reasonable to continue trying medical management. Patient understand that if she does not improve in the next 48-72 hours, surgical management will need to be re considered.   [name], MD

## 2019-06-24 NOTE — Progress Notes (Signed)
Peripherally Inserted Central Catheter Placement  The IV Nurse has discussed with the patient and/or persons authorized to consent for the patient, the purpose of this procedure and the potential benefits and risks involved with this procedure.  The benefits include less needle sticks, lab draws from the catheter, and the patient may be discharged home with the catheter. Risks include, but not limited to, infection, bleeding, blood clot (thrombus formation), and puncture of an artery; nerve damage and irregular heartbeat and possibility to perform a PICC exchange if needed/ordered by physician.  Alternatives to this procedure were also discussed.  B[employer] PICC patient education guide, fact sheet on infection prevention and patient information card has been provided to patient /or left at bedside.    PICC Placement Documentation        Rolena Infante 06/24/2019, 7:11 PM

## 2019-06-24 NOTE — Progress Notes (Signed)
Spoke with Pharmacist re TNA to be started today or 06/25/19.  States still waiting on nutrition consult prior to making TNA due to time of day of order, pharmacists expects TNA to be initiated 06/25/19 per recommendations.  Will plan on PICC placement once recommendations made.

## 2019-06-25 ENCOUNTER — Encounter: Payer: Self-pay | Admitting: General Surgery

## 2019-06-25 LAB — COMPREHENSIVE METABOLIC PANEL
ALT: 23 U/L (ref 0–44)
AST: 16 U/L (ref 15–41)
Albumin: 2.4 g/dL — ABNORMAL LOW (ref 3.5–5.0)
Alkaline Phosphatase: 107 U/L (ref 38–126)
Anion gap: 12 (ref 5–15)
BUN: <5 mg/dL — ABNORMAL LOW (ref 6–20)
CO2: 23 mmol/L (ref 22–32)
Calcium: 8.1 mg/dL — ABNORMAL LOW (ref 8.9–10.3)
Chloride: 103 mmol/L (ref 98–111)
Creatinine, Ser: 0.68 mg/dL (ref 0.44–1.00)
GFR calc Af Amer: >60 mL/min (ref >60)
GFR calc non Af Amer: >60 mL/min (ref >60)
Glucose, Bld: 74 mg/dL (ref 70–99)
Potassium: 2.8 mmol/L — ABNORMAL LOW (ref 3.5–5.1)
Sodium: 138 mmol/L (ref 135–145)
Total Bilirubin: 1.6 mg/dL — ABNORMAL HIGH (ref 0.3–1.2)
Total Protein: 6 g/dL — ABNORMAL LOW (ref 6.5–8.1)

## 2019-06-25 LAB — DIFFERENTIAL
Abs Immature Granulocytes: 0.17 10*3/uL — ABNORMAL HIGH (ref 0.00–0.07)
Basophils Absolute: 0.1 10*3/uL (ref 0.0–0.1)
Basophils Relative: 1 %
Eosinophils Absolute: 0.1 10*3/uL (ref 0.0–0.5)
Eosinophils Relative: 1 %
Immature Granulocytes: 2 %
Lymphocytes Relative: 15 %
Lymphs Abs: 1.7 10*3/uL (ref 0.7–4.0)
Monocytes Absolute: 0.9 10*3/uL (ref 0.1–1.0)
Monocytes Relative: 8 %
Neutro Abs: 8.1 10*3/uL — ABNORMAL HIGH (ref 1.7–7.7)
Neutrophils Relative %: 73 %

## 2019-06-25 LAB — PREALBUMIN: Prealbumin: <5 mg/dL — ABNORMAL LOW (ref 18–38)

## 2019-06-25 LAB — CBC
HCT: 33.3 % — ABNORMAL LOW (ref 36.0–46.0)
Hemoglobin: 11.2 g/dL — ABNORMAL LOW (ref 12.0–15.0)
MCH: 29.2 pg (ref 26.0–34.0)
MCHC: 33.6 g/dL (ref 30.0–36.0)
MCV: 86.9 fL (ref 80.0–100.0)
Platelets: 274 10*3/uL (ref 150–400)
RBC: 3.83 MIL/uL — ABNORMAL LOW (ref 3.87–5.11)
RDW: 14.3 % (ref 11.5–15.5)
WBC: 11 10*3/uL — ABNORMAL HIGH (ref 4.0–10.5)
nRBC: 0 % (ref 0.0–0.2)

## 2019-06-25 LAB — MAGNESIUM: Magnesium: 1.8 mg/dL (ref 1.7–2.4)

## 2019-06-25 LAB — TRIGLYCERIDES: Triglycerides: 261 mg/dL — ABNORMAL HIGH (ref <150)

## 2019-06-25 LAB — PHOSPHORUS: Phosphorus: 3.9 mg/dL (ref 2.5–4.6)

## 2019-06-25 MED ORDER — INSULIN ASPART 100 UNIT/ML ~~LOC~~ SOLN: 0.0000 [IU] | Freq: Every day | SUBCUTANEOUS | Status: DC

## 2019-06-25 MED ORDER — POTASSIUM CHLORIDE IN NACL 20-0.9 MEQ/L-% IV SOLN
INTRAVENOUS | Status: AC
  Filled 2019-06-25 (×2): qty 1000

## 2019-06-25 MED ORDER — SODIUM CHLORIDE 0.9% FLUSH: 10.0000 mL | INTRAVENOUS | Status: DC | PRN

## 2019-06-25 MED ORDER — TRACE MINERALS CU-MN-SE-ZN 300-55-60-3000 MCG/ML IV SOLN
INTRAVENOUS | Status: AC
  Filled 2019-06-25: qty 960

## 2019-06-25 MED ORDER — CHLORHEXIDINE GLUCONATE CLOTH 2 % EX PADS
6.0000 | MEDICATED_PAD | Freq: Every day | CUTANEOUS | Status: DC
  Administered 2019-06-25 – 2019-07-03 (×8): 6 via TOPICAL

## 2019-06-25 MED ORDER — INSULIN ASPART 100 UNIT/ML ~~LOC~~ SOLN
0.0000 [IU] | Freq: Three times a day (TID) | SUBCUTANEOUS | Status: DC
  Administered 2019-06-26: 1 [IU] via SUBCUTANEOUS
  Administered 2019-06-27 – 2019-06-28 (×4): 2 [IU] via SUBCUTANEOUS
  Administered 2019-06-28: 1 [IU] via SUBCUTANEOUS
  Administered 2019-06-29: 2 [IU] via SUBCUTANEOUS
  Administered 2019-06-29: 1 [IU] via SUBCUTANEOUS
  Administered 2019-06-29: 2 [IU] via SUBCUTANEOUS
  Administered 2019-06-30 – 2019-07-01 (×3): 1 [IU] via SUBCUTANEOUS
  Filled 2019-06-25 (×8): qty 1

## 2019-06-25 MED ORDER — POTASSIUM CHLORIDE CRYS ER 20 MEQ PO TBCR
40.0000 meq | EXTENDED_RELEASE_TABLET | Freq: Once | ORAL | Status: AC
  Administered 2019-06-25: 40 meq via ORAL
  Filled 2019-06-25: qty 2

## 2019-06-25 MED ORDER — FAT EMULSION PLANT BASED 20 % IV EMUL
250.0000 mL | INTRAVENOUS | Status: AC
  Administered 2019-06-25: 250 mL via INTRAVENOUS
  Filled 2019-06-25: qty 250

## 2019-06-25 MED ORDER — SODIUM CHLORIDE 0.9% FLUSH
10.0000 mL | Freq: Two times a day (BID) | INTRAVENOUS | Status: DC
  Administered 2019-06-25 (×2): 10 mL
  Administered 2019-06-26: 1 mL
  Administered 2019-06-26 – 2019-07-04 (×14): 10 mL

## 2019-06-25 NOTE — Progress Notes (Signed)
[facility] Hospital Day(s): 5.   Post op day(s):  Marland Kitchen   Interval History: Patient seen and examined, no acute events or new complaints overnight. Patient reports feeling okay.  He denies any worsening symptoms.  He denies worsening abdominal pain.  She continued having localized significant pain in the right lower quadrant.  She is also continued feeling bloated.  She reported she continue passing gas.  She is ambulating.  Vital signs in last 24 hours: [min-max] current  Temp:  [98.1 F (36.7 C)-100.5 F (38.1 C)] 98.7 F (37.1 C) (05/21 1239) Pulse Rate:  [80-92] 80 (05/21 1239) Resp:  [14-20] 14 (05/21 1239) BP: (121-135)/(64-69) 130/67 (05/21 1239) SpO2:  [94 %-97 %] 96 % (05/21 1239)     Height: 5\' 10"  (177.8 cm) Weight: 93 kg BMI (Calculated): 29.41   Physical Exam:  Constitutional: alert, cooperative and no distress  Respiratory: breathing non-labored at rest  Cardiovascular: regular rate and sinus rhythm  Gastrointestinal: soft, moderate-tender, and distended  Labs:  CBC Latest Ref Rng & Units 06/25/2019 06/23/2019 06/21/2019  WBC 4.0 - 10.5 K/uL 11.0(H) 13.2(H) 16.9(H)  Hemoglobin 12.0 - 15.0 g/dL 11.2(L) 12.7 12.4  Hematocrit 36.0 - 46.0 % 33.3(L) 38.2 35.8(L)  Platelets 150 - 400 K/uL 274 259 214   CMP Latest Ref Rng & Units 06/25/2019 06/23/2019 06/21/2019  Glucose 70 - 99 mg/dL 74 100(H) 133(H)  BUN 6 - 20 mg/dL <5(L) 7 14  Creatinine 0.44 - 1.00 mg/dL 0.68 0.55 0.77  Sodium 135 - 145 mmol/L 138 136 137  Potassium 3.5 - 5.1 mmol/L 2.8(L) 3.6 3.4(L)  Chloride 98 - 111 mmol/L 103 104 103  CO2 22 - 32 mmol/L 23 23 24   Calcium 8.9 - 10.3 mg/dL 8.1(L) 8.0(L) 8.1(L)  Total Protein 6.5 - 8.1 g/dL 6.0(L) - -  Total Bilirubin 0.3 - 1.2 mg/dL 1.6(H) - -  Alkaline Phos 38 - 126 U/L 107 - -  AST 15 - 41 U/L 16 - -  ALT 0 - 44 U/L 23 - -    Imaging studies: No new pertinent imaging studies   Assessment/Plan:  49 y.o.femalewith acute appendicitis with  phlegmon.  Patient without clinical deterioration.  She continue with stable vital signs.  There was a low-grade fever yesterday.  Today she continue in a good mood, ambulating.  Still with bloating and abdominal distention but passing gas.  PICC line placed this morning.  Will start TPN.  Patient again oriented about the plan of repeating CT scan tomorrow to see if there is any change to make decision regarding continue conservative management versus surgical therapy.  We will continue with DVT prophylaxis.  We will continue with IV antibiotic therapy.   [name], MD

## 2019-06-25 NOTE — Plan of Care (Signed)
Continuing with plan of care. 

## 2019-06-25 NOTE — Progress Notes (Addendum)
Initial Nutrition Assessment  DOCUMENTATION CODES:   Not applicable  INTERVENTION:    Initiate Clinimix 5/20 with electrolytes at 93m/hr x 24 hrs (Goal rate 8102mhr once labs stable)  Initiate 20% ILE _0  daily x 12 hours    Regimen at goal rate provides 2252kcal/day, 100g/day protein, 22423molume    Add MVI and trace elements daily    Add IV thiamine 100m50mily x 3 days   Pt at high refeeding risk; monitor electrolytes until stable.  Monitor triglycerides    Daily weights   Discontinue IVF per MD  NUTRITION DIAGNOSIS:   Inadequate oral intake related to acute illness as evidenced by per patient/family report, meal completion < 25%.  GOAL:   Patient will meet greater than or equal to 90% of their needs  MONITOR:   PO intake, Labs, Weight trends, Skin, I & O's, Other (Comment)(TPN)  REASON FOR ASSESSMENT:   Consult New TPN/TNA  ASSESSMENT:   48 y71r old patient with acute appendicitis with phlegmonous process and severe inflammation of the cecum and terminal ileum.  Met with pt in room today. Pt reports that the last good meal she ate was on Saturday 5/15. Pt reports eating some broth and [place] Monday and Tuesday. Pt was advanced to a full liquid diet on Wednesday but reports that she was unable to tolerate the potato soup that she ate. Pt reports drinking some coffee for breakfast this morning. Pt has now been without adequate nutrition for 6 days. Plan is for PICC line and TPN today. Pt reports continued bloating and abdominal discomfort this morning. Plan for now is conservative management but patient may require surgery if no improvement per MD note. Of note, pt is amendable to drinking Ensure if needed.   Pt reports that her UBW is ~205lbs. Per chart, pt is weight stable pta.   Medications reviewed and include: lovenox, protonix, NaCl w/ KCl _1 /hr, zosyn  Labs reviewed: K 2.8(L), P 3.9 wnl, Mg 1.8 wnl, tbili 1.6(H) Triglycerides- 261(H) Wbc-  11.0(H)  NUTRITION - FOCUSED PHYSICAL EXAM:    Most Recent Value  Orbital Region  No depletion  Upper Arm Region  No depletion  Thoracic and Lumbar Region  No depletion  Buccal Region  No depletion  Temple Region  No depletion  Clavicle Bone Region  No depletion  Clavicle and Acromion Bone Region  No depletion  Scapular Bone Region  No depletion  Dorsal Hand  No depletion  Patellar Region  No depletion  Anterior Thigh Region  No depletion  Posterior Calf Region  No depletion  Edema (RD Assessment)  None  Hair  Reviewed  Eyes  Reviewed  Mouth  Reviewed  Skin  Reviewed  Nails  Reviewed     Diet Order:   Diet Order            Diet full liquid Room service appropriate? Yes; Fluid consistency: Thin  Diet effective now             EDUCATION NEEDS:   Education needs have been addressed  Skin:  Skin Assessment: Reviewed RN Assessment  Last BM:  5/20- type 7  Height:   Ht Readings from Last 1 Encounters:  06/20/19 _2  (1.778 m)    Weight:   Wt Readings from Last 1 Encounters:  06/20/19 93 kg    Ideal Body Weight:  75.45 kg  BMI:  Body mass index is 29.41 kg/m.  Estimated Nutritional Needs:   Kcal:  2000-2300kcal/day  Protein:  100-115g/day  Fluid:  >2L/day  Koleen Distance MS, RD, LDN Please refer to [facility] for RD and/or RD on-call/weekend/after hours pager

## 2019-06-25 NOTE — Consult Note (Signed)
PHARMACY - TOTAL PARENTERAL NUTRITION CONSULT NOTE   Indication: Inadequate oral intake related to acute illness as evidenced by per patient/family report, meal completion < 25%  Patient Measurements: Height: 5\' 10"  (177.8 cm) Weight: 93 kg (205 lb) IBW/kg (Calculated) : 68.5 TPN AdjBW (KG): 74.6 Body mass index is 29.41 kg/m.  Assessment: 49 year old patient with acute appendicitis with phlegmonous process and severe inflammation of the cecum and terminal ileum. Pt reports that the last good meal she ate was on Saturday 06/19/19. CT shows Dilated small bowel loops throughout upper and mid abdomen with decompressed and mildly thickened bowel distal small bowel loops, favoring ileus.  Glucose / Insulin: BG controlled on no insulin products Electrolytes: K 2.8, other electrolytes WNL Renal: SCr<1, controlled LFTs / TGs: LFTs wnl, baseline TG 261 Prealbumin / albumin: albumin 2.4 Intake / Output; MIVF:  0.9% NaCl w/ 20 mEq KCl/L at 100 mL/hr GI Imaging: Surgeries / Procedures:   Central access: 06/25/19 TPN start date: 06/25/19  Nutritional Goals (per RD recommendation on 06/25/19): kCal: 2000 - 2300, Protein: 100 - 115 g/day, Fluid: >2L/day Goal TPN rate is 83 mL/hr (provides 100 g of protein and 2252 kcals per day)  Current Nutrition:  Full liquids  Plan:  Start E 5/20 TPN at 40 mL/hr at 1800 + 20% ILE at 20 mL/hr x 12 hours Electrolytes in TPN: 67mEq/L of Na, 100mEq/L of K, 59mEq/L of Ca, 53mEq/L of Mg, and 56mmol/L of Phos. Cl:Ac ratio 1:1 Add standard MVI and trace elements, thiamine 100 mg daily x 3 days  to TPN Initiate Sensitive ACHS SSI and adjust as needed  Stop MIVF when TPN starts at 1800 40 mEq oral KCl x 1 Monitor TPN labs on Mon/Thurs when stabilized but daily until that occurs  Dallie Piles 06/25/2019,7:14 AM

## 2019-06-25 NOTE — Progress Notes (Signed)
Peripherally Inserted Central Catheter Placement  The IV Nurse has discussed with the patient and/or persons authorized to consent for the patient, the purpose of this procedure and the potential benefits and risks involved with this procedure.  The benefits include less needle sticks, lab draws from the catheter, and the patient may be discharged home with the catheter. Risks include, but not limited to, infection, bleeding, blood clot (thrombus formation), and puncture of an artery; nerve damage and irregular heartbeat and possibility to perform a PICC exchange if needed/ordered by physician.  Alternatives to this procedure were also discussed.  Bard Power PICC patient education guide, fact sheet on infection prevention and patient information card has been provided to patient /or left at bedside.    PICC Placement Documentation  PICC Double Lumen 06/25/19 PICC Right Brachial 38 cm 0 cm (Active)  Indication for Insertion or Continuance of Line Administration of hyperosmolar/irritating solutions (i.e. TPN, Vancomycin, etc.) 06/25/19 1226  Exposed Catheter (cm) 0 cm 06/25/19 1226  Site Assessment Clean;Dry;Intact 06/25/19 1226  Lumen #1 Status Flushed;Blood return noted 06/25/19 1226  Lumen #2 Status Flushed;Blood return noted 06/25/19 1226  Dressing Type Transparent 06/25/19 1226  Dressing Status Clean;Dry;Intact;Antimicrobial disc in place;Other (Comment) 06/25/19 1226  Dressing Intervention New dressing 06/25/19 1226  Dressing Change Due 07/02/19 06/25/19 1226       Kerri Sullivan 06/25/2019, 12:28 PM

## 2019-06-26 ENCOUNTER — Inpatient Hospital Stay: Payer: BC Managed Care – PPO

## 2019-06-26 ENCOUNTER — Encounter: Payer: Self-pay | Admitting: General Surgery

## 2019-06-26 LAB — RENAL FUNCTION PANEL
Albumin: 2.4 g/dL — ABNORMAL LOW (ref 3.5–5.0)
Anion gap: 7 (ref 5–15)
BUN: <5 mg/dL — ABNORMAL LOW (ref 6–20)
CO2: 27 mmol/L (ref 22–32)
Calcium: 8.2 mg/dL — ABNORMAL LOW (ref 8.9–10.3)
Chloride: 103 mmol/L (ref 98–111)
Creatinine, Ser: 0.63 mg/dL (ref 0.44–1.00)
GFR calc Af Amer: >60 mL/min (ref >60)
GFR calc non Af Amer: >60 mL/min (ref >60)
Glucose, Bld: 185 mg/dL — ABNORMAL HIGH (ref 70–99)
Phosphorus: 3 mg/dL (ref 2.5–4.6)
Potassium: 3.1 mmol/L — ABNORMAL LOW (ref 3.5–5.1)
Sodium: 137 mmol/L (ref 135–145)

## 2019-06-26 LAB — GLUCOSE, CAPILLARY
Glucose-Capillary: 138 mg/dL — ABNORMAL HIGH (ref 70–99)
Glucose-Capillary: 117 mg/dL — ABNORMAL HIGH (ref 70–99)
Glucose-Capillary: 97 mg/dL (ref 70–99)
Glucose-Capillary: 185 mg/dL — ABNORMAL HIGH (ref 70–99)

## 2019-06-26 LAB — MAGNESIUM: Magnesium: 2 mg/dL (ref 1.7–2.4)

## 2019-06-26 MED ORDER — KETOROLAC TROMETHAMINE 30 MG/ML IJ SOLN: 30.0000 mg | Freq: Three times a day (TID) | INTRAMUSCULAR | Status: DC

## 2019-06-26 MED ORDER — KETOROLAC TROMETHAMINE 30 MG/ML IJ SOLN
30.0000 mg | Freq: Three times a day (TID) | INTRAMUSCULAR | Status: AC | PRN
  Administered 2019-06-27 – 2019-07-01 (×13): 30 mg via INTRAVENOUS
  Filled 2019-06-26 (×13): qty 1

## 2019-06-26 MED ORDER — KETOROLAC TROMETHAMINE 30 MG/ML IJ SOLN
INTRAMUSCULAR | Status: AC
  Administered 2019-06-26: 30 mg
  Filled 2019-06-26: qty 1

## 2019-06-26 MED ORDER — MIDAZOLAM HCL 2 MG/2ML IJ SOLN
INTRAMUSCULAR | Status: AC
  Filled 2019-06-26: qty 2

## 2019-06-26 MED ORDER — MIDAZOLAM HCL 2 MG/2ML IJ SOLN
INTRAMUSCULAR | Status: DC | PRN
  Administered 2019-06-26 (×2): 1 mg via INTRAVENOUS

## 2019-06-26 MED ORDER — SODIUM CHLORIDE 0.9% FLUSH
5.0000 mL | Freq: Three times a day (TID) | INTRAVENOUS | Status: DC
  Administered 2019-06-26 – 2019-07-04 (×23): 5 mL

## 2019-06-26 MED ORDER — FENTANYL CITRATE (PF) 100 MCG/2ML IJ SOLN
INTRAMUSCULAR | Status: DC | PRN
  Administered 2019-06-26 (×2): 50 ug via INTRAVENOUS

## 2019-06-26 MED ORDER — FAT EMULSION PLANT BASED 20 % IV EMUL
250.0000 mL | INTRAVENOUS | Status: AC
  Administered 2019-06-26: 250 mL via INTRAVENOUS
  Filled 2019-06-26: qty 250

## 2019-06-26 MED ORDER — FENTANYL CITRATE (PF) 100 MCG/2ML IJ SOLN
INTRAMUSCULAR | Status: AC
  Filled 2019-06-26: qty 2

## 2019-06-26 MED ORDER — TRACE MINERALS CU-MN-SE-ZN 300-55-60-3000 MCG/ML IV SOLN
83.0000 mL/h | INTRAVENOUS | Status: AC
  Filled 2019-06-26: qty 1992

## 2019-06-26 MED ORDER — POTASSIUM CHLORIDE 10 MEQ/50ML IV SOLN
10.0000 meq | INTRAVENOUS | Status: AC
  Administered 2019-06-26 (×3): 10 meq via INTRAVENOUS
  Filled 2019-06-26 (×3): qty 50

## 2019-06-26 MED ORDER — IOHEXOL 300 MG/ML  SOLN
100.0000 mL | Freq: Once | INTRAMUSCULAR | Status: AC | PRN
  Administered 2019-06-26: 100 mL via INTRAVENOUS

## 2019-06-26 NOTE — Procedures (Signed)
Interventional Radiology Procedure:   Indications: Acute appendicitis with intra-abdominal fluid collection  Procedure: CT guided abscess drainage  Findings: 10 Fr drain placed in right anterior abdominal abscess.  Removed 40 ml of tan colored purulent fluid  Complications: None     EBL: less than 10 ml  Plan: Follow output and send fluid for culture.    Koen Antilla R. Anselm Pancoast, MD  Pager: [phone]

## 2019-06-26 NOTE — Progress Notes (Signed)
Patient had stable vital signs on the unit before and upon return from procedure.

## 2019-06-26 NOTE — Plan of Care (Signed)
Continuing with plan of care. 

## 2019-06-26 NOTE — Consult Note (Signed)
PHARMACY - TOTAL PARENTERAL NUTRITION CONSULT NOTE   Indication: Inadequate oral intake related to acute illness as evidenced by per patient/family report, meal completion < 25%  Patient Measurements: Height: 5\' 10"  (177.8 cm) Weight: 93 kg (205 lb) IBW/kg (Calculated) : 68.5 TPN AdjBW (KG): 74.6 Body mass index is 29.41 kg/m.  Assessment: 49 year old patient with acute appendicitis with phlegmonous process and severe inflammation of the cecum and terminal ileum. Pt reports that the last good meal she ate was on Saturday 06/19/19. CT shows Dilated small bowel loops throughout upper and mid abdomen with decompressed and mildly thickened bowel distal small bowel loops, favoring ileus.  Glucose / Insulin: BG controlled on SSI Electrolytes: K 2.8, other electrolytes WNL Renal: SCr<1, controlled LFTs / TGs: LFTs wnl, baseline TG 261 Prealbumin / albumin: albumin 2.4   Central access: 06/25/19 TPN start date: 06/25/19  Nutritional Goals (per RD recommendation on 06/25/19): kCal: 2000 - 2300, Protein: 100 - 115 g/day, Fluid: >2L/day Goal TPN rate is 83 mL/hr (provides 100 g of protein and 2252 kcals per day)  Current Nutrition:  Full liquids  Plan:  Advance Clinimix E 5/20 TPN to 83 mL/hr at 1800 Continue 20% ILE at 20 mL/hr x 12 hours Electrolytes in TPN: 46mEq/L of Na, 12mEq/L of K, 62mEq/L of Ca, 40mEq/L of Mg, and 28mmol/L of Phos. Cl:Ac ratio 1:1 Add standard MVI and trace elements, thiamine 100 mg daily x 3 days  to TPN Initiate Sensitive ACHS SSI and adjust as needed  Potassium 10 mEq IV x 3, patient will also receive 50 mEq/L in TPN Monitor TPN labs on Mon/Thurs when stabilized but daily until that occurs  Tawnya Crook, PharmD 06/26/2019,12:31 PM

## 2019-06-26 NOTE — Progress Notes (Signed)
Patient's vital signs that are normal upon return.

## 2019-06-26 NOTE — Progress Notes (Signed)
[place] Hospital Day(s): 6.   Post op day(s):  Marland Kitchen   Interval History: Patient seen and examined, no acute events or new complaints overnight. Patient reports feeling better today.  She reported that she still feels bloated but she does not have the stabbing pain that she was having initially.  She does has localized pain in the right lower quadrant.  There is no pain radiation.  Alleviating factor has been pain medication.  Aggravating factors are applying pressure.  Vital signs in last 24 hours: [min-max] current  Temp:  [98.3 F (36.8 C)-98.7 F (37.1 C)] 98.3 F (36.8 C) (05/22 1147) Pulse Rate:  [78-86] 78 (05/22 1147) Resp:  [15-20] 15 (05/22 1147) BP: (122-139)/(66-79) 126/79 (05/22 1147) SpO2:  [95 %-98 %] 95 % (05/22 1147)     Height: 5\' 10"  (177.8 cm) Weight: 93 kg BMI (Calculated): 29.41   Physical Exam:  Constitutional: alert, cooperative and no distress  Respiratory: breathing non-labored at rest  Cardiovascular: regular rate and sinus rhythm  Gastrointestinal: soft, mild-tender, and mild-distended  Labs:  CBC Latest Ref Rng & Units 06/25/2019 06/23/2019 06/21/2019  WBC 4.0 - 10.5 K/uL 11.0(H) 13.2(H) 16.9(H)  Hemoglobin 12.0 - 15.0 g/dL 11.2(L) 12.7 12.4  Hematocrit 36.0 - 46.0 % 33.3(L) 38.2 35.8(L)  Platelets 150 - 400 K/uL 274 259 214   CMP Latest Ref Rng & Units 06/26/2019 06/25/2019 06/23/2019  Glucose 70 - 99 mg/dL 185(H) 74 100(H)  BUN 6 - 20 mg/dL <5(L) <5(L) 7  Creatinine 0.44 - 1.00 mg/dL 0.63 0.68 0.55  Sodium 135 - 145 mmol/L 137 138 136  Potassium 3.5 - 5.1 mmol/L 3.1(L) 2.8(L) 3.6  Chloride 98 - 111 mmol/L 103 103 104  CO2 22 - 32 mmol/L 27 23 23   Calcium 8.9 - 10.3 mg/dL 8.2(L) 8.1(L) 8.0(L)  Total Protein 6.5 - 8.1 g/dL - 6.0(L) -  Total Bilirubin 0.3 - 1.2 mg/dL - 1.6(H) -  Alkaline Phos 38 - 126 U/L - 107 -  AST 15 - 41 U/L - 16 -  ALT 0 - 44 U/L - 23 -    Imaging studies: CT scan of the abdominal pelvis shows fluid  collection medial to the ascending colon.  I am waiting for interventional radiology to discuss if this is amenable for percutaneous drainage.  No free air.  Contrast in the large intestine.  Small bowel evaluation consistent with reactive ileus.   Assessment/Plan:  49 y.o.femalewith acute appendicitis with phlegmon.  Patient with appendicitis with abscess.  In my opinion all the inflammation is trying to get localized to the right lower quadrant.  Abscess in a difficult position.  I will discuss with IR to see if they have an adequate window for percutaneous drainage.  I discussed with her the alternative of right colectomy but she would prefer to continue current therapy as possible.  As soon as I discussed the case with IR I will discuss with the patient the alternatives.  We will continue with IV antibiotic therapy.  We will continue with TPN.  Patient ambulating.  We will continue with DVT prophylaxis.  [name], MD

## 2019-06-26 NOTE — Progress Notes (Signed)
Patient clinically stable post Abscess drain placement, tolerated well. Attached to JP bulb drain with purulent drainage removed. Awake/alert and oriented post procedure. Received Versed 2mg  along with Fentanyl 100 mcg Iv for procedure. Report given to [facility] post procedure with questions answered. Family member in room post procedure with update given.

## 2019-06-26 NOTE — Consult Note (Signed)
Chief Complaint: Patient was seen in consultation today for CT guided drainage  Referring Physician(s): Cintron-Diaz  Patient Status: [facility] - In-pt  History of Present Illness: Kerri Sullivan is a 49 y.o. with appendicitis.  CT of the abdomen and pelvis today demonstrated persistent small pocket of fluid in the right lower abdomen near the cecum and appendix.  This fluid collection is indeterminate but cannot exclude a developing abscess collection.  Patient also has abdominal distention and evidence for an ileus.  Patient main complaint is abdominal pain and distention.  Abdominal distention has slightly improved recently and she no longer has difficulty breathing.  Currently, she has no chest pain or difficulty breathing.  She denies recent chills or fevers.  Past Medical History:  Diagnosis Date  . Arthritis    rheumatoid arthritis-OFF METHOTREXATE SINCE 2016 AND DOING WELL PER PT (09-13-15)  . Asthma   . Family history of adverse reaction to anesthesia    MOM-N/V  . History of abnormal cervical Pap smear     Past Surgical History:  Procedure Laterality Date  . CHOLECYSTECTOMY  1998  . FOOT SURGERY Left    repair of tendon  . [place]   right x2 and left x 1-arthroscopy  . LIPOMA EXCISION N/A 09/21/2015   Procedure: EXCISION LIPOMA BACK OF NECK;  Surgeon: Christene Lye, MD;  Location: [facility] ORS;  Service: General;  Laterality: N/A;    Allergies: Patient has no known allergies.  Medications: Prior to Admission medications   Medication Sig Start Date End Date Taking? Authorizing Provider  fexofenadine (ALLEGRA) 180 MG tablet Take 180 mg by mouth daily.   Yes [provider]  Magnesium 250 MG TABS Take 250 mg by mouth daily.   Yes [provider]  Multiple Vitamin (MULTIVITAMIN WITH MINERALS) TABS tablet Take 1 tablet by mouth daily.   Yes [provider]  Omega-3 Fatty Acids (FISH OIL) 1200 MG CAPS Take 1,200 mg by mouth daily.    Yes [provider]  polycarbophil (FIBERCON) 625 MG tablet Take 625 mg by mouth daily.   Yes [provider]  TURMERIC PO Take 1 tablet by mouth daily.   Yes [provider]  vitamin B-12 (CYANOCOBALAMIN) 100 MCG tablet Take 100 mcg by mouth daily.   Yes [provider]     Family History  Problem Relation Age of Onset  . Cancer Paternal Aunt 17       breast  . Cancer Cousin        colon  . Osteoporosis Mother   . Heart disease Father   . Hypertension Father   . Diabetes Brother   . Alcoholism Brother   . Suicidality Brother   . Lung cancer Cousin        died late 22s    Social History   Socioeconomic History  . Marital status: Divorced    Spouse name: Not on file  . Number of children: 1  . Years of education: 71  . Highest education level: Not on file  Occupational History  . Occupation: Pharmacist, hospital  Tobacco Use  . Smoking status: Never Smoker  . Smokeless tobacco: Never Used  Substance and Sexual Activity  . Alcohol use: Yes    Comment: RARE  . Drug use: No  . Sexual activity: Not Currently    Birth control/protection: None  Other Topics Concern  . Not on file  Social History Narrative  . Not on file   Social  Determinants of Health   Financial Resource Strain:   . Difficulty of Paying Living Expenses:   Food Insecurity:   . Worried About Charity fundraiser in the Last Year:   . Arboriculturist in the Last Year:   Transportation Needs:   . Film/video editor (Medical):   Marland Kitchen Lack of Transportation (Non-Medical):   Physical Activity:   . Days of Exercise per Week:   . Minutes of Exercise per Session:   Stress:   . Feeling of Stress :   Social Connections:   . Frequency of Communication with Friends and Family:   . Frequency of Social Gatherings with Friends and Family:   . Attends Religious Services:   . Active Member of Clubs or Organizations:   . Attends Archivist Meetings:   Marland Kitchen Marital Status:        Review of Systems  Constitutional: Negative for chills and fever.  Respiratory: Negative.   Cardiovascular: Negative.   Gastrointestinal: Positive for abdominal distention and abdominal pain.    Vital Signs: BP 126/79 (BP Location: Left Arm)   Pulse 78   Temp 98.3 F (36.8 C) (Oral)   Resp 15   Ht 5\' 10"  (1.778 m)   Wt 93 kg   LMP 06/18/2019 (Exact Date)   SpO2 95%   BMI 29.41 kg/m   Physical Exam Cardiovascular:     Rate and Rhythm: Normal rate and regular rhythm.     Heart sounds: Normal heart sounds.  Pulmonary:     Effort: Pulmonary effort is normal.     Breath sounds: Normal breath sounds.  Abdominal:     General: There is distension.  Neurological:     Mental Status: She is alert.     Imaging: CT ABDOMEN PELVIS W CONTRAST  Result Date: 06/26/2019 CLINICAL DATA:  Follow-up appendicitis EXAM: CT ABDOMEN AND PELVIS WITH CONTRAST TECHNIQUE: Multidetector CT imaging of the abdomen and pelvis was performed using the standard protocol following bolus administration of intravenous contrast. CONTRAST:  132mL OMNIPAQUE IOHEXOL 300 MG/ML  SOLN COMPARISON:  06/24/2019, 06/20/2019 FINDINGS: Lower chest: Small bilateral pleural effusions, unchanged compared to prior examination. Hepatobiliary: No focal liver abnormality is seen. Status post cholecystectomy. No biliary dilatation. Pancreas: Unremarkable. No pancreatic ductal dilatation or surrounding inflammatory changes. Spleen: Normal in size without significant abnormality. Adrenals/Urinary Tract: Adrenal glands are unremarkable. Kidneys are normal, without renal calculi, solid lesion, or hydronephrosis. Bladder is unremarkable. Stomach/Bowel: Stomach is within normal limits. The proximal to mid small bowel is diffusely fluid-filled and distended, measuring up to 4.6 cm in caliber. There is no obvious transition point although there is a gradual decrease in caliber of the small bowel in the vicinity of multiple thickened  loops in the anterior right lower quadrant. There is gas and stool present in the colon to the rectum. Unchanged thickened appendix in the right lower quadrant measuring up to 1.5 cm in caliber with small appendicolith near the base. Unchanged small pocket of ascites or fluid collection adjacent to the cecal base, measuring approximately 4.6 x 3.4 cm although distinct from the appendix (series 2, image 74). Vascular/Lymphatic: Aortic atherosclerosis. No enlarged abdominal or pelvic lymph nodes. Reproductive: No mass or other significant abnormality. Other: No abdominal wall hernia or abnormality. Small volume pelvic ascites, unchanged compared to prior examination. Musculoskeletal: No acute or significant osseous findings. IMPRESSION: 1. Unchanged thickened appendix in the right lower quadrant measuring up to 1.5 cm in caliber with small appendicolith near the  base, consistent with acute appendicitis. 2. Unchanged small pocket of ascites or fluid collection adjacent to the cecal base, measuring approximately 4.6 x 3.4 cm although distinct from the appendix. 3. The proximal to mid small bowel is diffusely fluid-filled and distended, measuring up to 4.6 cm in caliber. There is no obvious transition point although there is a gradual decrease in caliber of the small bowel in the vicinity of multiple thickened loops in the anterior right lower quadrant. There is gas and stool present in the colon to the rectum. Findings are most consistent with ileus. 4. Small volume pleural effusions and ascites. 5. Aortic Atherosclerosis (ICD10-I70.0). Electronically Signed   By: Eddie Candle M.D.   On: 06/26/2019 13:37   CT ABDOMEN PELVIS W CONTRAST  Result Date: 06/24/2019 CLINICAL DATA:  Acute appendicitis with phlegmon diagnosed 4 days ago, follow-up EXAM: CT ABDOMEN AND PELVIS WITH CONTRAST TECHNIQUE: Multidetector CT imaging of the abdomen and pelvis was performed using the standard protocol following bolus administration of  intravenous contrast. Sagittal and coronal MPR images reconstructed from axial data set. CONTRAST:  181mL OMNIPAQUE IOHEXOL 300 MG/ML SOLN IV. Dilute oral contrast. COMPARISON:  06/20/2019 FINDINGS: Lower chest: Bibasilar atelectasis and minimal pleural effusions. Hepatobiliary: Gallbladder surgically absent. Liver normal appearance Pancreas: Normal appearance Spleen: Normal appearance Adrenals/Urinary Tract: Adrenal glands, kidneys, ureters, and bladder normal appearance Stomach/Bowel: Again identified enlarged thickened appendix with periappendiceal inflammatory changes and small appendicolith consistent with acute appendicitis. Dilated small bowel loops throughout upper and mid abdomen. Thickened cecum and distal small bowel loops, which appear decompressed, favor ileus proximally though obstruction not entirely excluded. Colon decompressed. Stomach unremarkable. Vascular/Lymphatic: Vascular structures patent. Mild scattered atherosclerotic calcification aorta without aneurysm. No adenopathy. Reproductive: Normal appearing uterus and ovaries Other: Free fluid in pelvis and mildly scattered interloop, with minimal peritoneal enhancement in the pelvis which could represent peritonitis. Small focal fluid collection medial to the cecum, rounded, could represent developing abscess 4.0 x 2.8 x 2.7 cm. No additional focal fluid collections identified. Small amount of fluid at the pericolic gutters and perihepatic. Musculoskeletal: Osseous structures unremarkable IMPRESSION: Acute appendicitis with small appendicoliths. Small focal fluid collection medial to cecum question developing abscess 4.0 x 2.8 x 2.7 cm. Scattered ascites greatest in pelvis with minimal peritoneal enhancement in the pelvis which could reflect peritonitis. Dilated small bowel loops throughout upper and mid abdomen with decompressed and mildly thickened bowel distal small bowel loops, favor ileus. Bibasilar atelectasis and minimal pleural effusions.  Aortic Atherosclerosis (ICD10-I70.0). These results will be called to the ordering clinician or representative by the Radiologist Assistant, and communication documented in the PACS or [employer]. Electronically Signed   By: Lavonia Dana M.D.   On: 06/24/2019 11:44   CT ABDOMEN PELVIS W CONTRAST  Result Date: 06/20/2019 CLINICAL DATA:  Stabbing pain in stomach EXAM: CT ABDOMEN AND PELVIS WITH CONTRAST TECHNIQUE: Multidetector CT imaging of the abdomen and pelvis was performed using the standard protocol following bolus administration of intravenous contrast. CONTRAST:  186mL OMNIPAQUE IOHEXOL 300 MG/ML  SOLN COMPARISON:  None. FINDINGS: Lower chest: Lung bases are clear. Normal heart size. No pericardial effusion. Hepatobiliary: No focal liver abnormality is seen. Patient is post cholecystectomy. Slight prominence of the biliary tree likely related to reservoir effect. No calcified intraductal gallstones. Pancreas: Unremarkable. No pancreatic ductal dilatation or surrounding inflammatory changes. Spleen: Normal in size without focal abnormality. Adrenals/Urinary Tract: Normal adrenals. Kidneys are normally located with symmetric enhancement and excretion. No suspicious renal lesion, urolithiasis or hydronephrosis. Urinary  bladder is largely decompressed at the time of exam and therefore poorly evaluated by CT imaging. No visible bladder abnormality. Stomach/Bowel: There is a fluid-filled, thickened and hyperemic appendix containing at least 3 small fecalith measuring up to 7 mm in size extensive periappendiceal phlegmon and fat stranding as well as reactive thickening of the cecum and terminal ileum. No extraluminal gas. Trace fluid in the pericolic gutters favored to be reactive. Distal esophagus, stomach and duodenal sweep are unremarkable. No proximal small bowel wall thickening or dilatation. No other colonic dilatation or wall thickening. No evidence of obstruction. Vascular/Lymphatic: Atherosclerotic  calcifications throughout the abdominal aorta and branch vessels. No aneurysm or ectasia. No enlarged abdominopelvic lymph nodes. Few reactive nodes in the right lower quadrant. Reproductive: Normal appearance of the uterus and adnexal structures. Radiolucent tampon within the vaginal canal. Other: Phlegmon and trace reactive fluid in the right pericolic gutter adjacent the appendix, as above. No free air. No bowel containing hernias Musculoskeletal: Mild straightening of the normal spinal curvature. Discogenic change L5-S1 with small global disc bulge and facet arthropathy. No acute osseous abnormality or suspicious osseous lesion. IMPRESSION: Acute, uncomplicated appendicitis with extensive periappendiceal phlegmon and fat stranding as well as reactive thickening of the cecum and terminal ileum. No evidence of perforation or visible abscess formation. Electronically Signed   By: Lovena Le M.D.   On: 06/20/2019 14:54   [place] EKG SITE RITE  Result Date: 06/24/2019 If Site Rite image not attached, placement could not be confirmed due to current cardiac rhythm.   Labs:  CBC: Recent Labs    06/20/19 1329 06/21/19 0322 06/23/19 0803 06/25/19 0435  WBC 17.4* 16.9* 13.2* 11.0*  HGB 14.2 12.4 12.7 11.2*  HCT 41.1 35.8* 38.2 33.3*  PLT 259 214 259 274    COAGS: No results for input(s): INR, APTT in the last 8760 hours.  BMP: Recent Labs    06/21/19 0322 06/23/19 0803 06/25/19 0435 06/26/19 0440  NA 137 136 138 137  K 3.4* 3.6 2.8* 3.1*  CL 103 104 103 103  CO2 24 23 23 27   GLUCOSE 133* 100* 74 185*  BUN 14 7 <5* <5*  CALCIUM 8.1* 8.0* 8.1* 8.2*  CREATININE 0.77 0.55 0.68 0.63  GFRNONAA >60 >60 >60 >60  GFRAA >60 >60 >60 >60    LIVER FUNCTION TESTS: Recent Labs    06/20/19 1329 06/25/19 0435 06/26/19 0440  BILITOT 0.8 1.6*  --   AST 22 16  --   ALT 28 23  --   ALKPHOS 67 107  --   PROT 7.5 6.0*  --   ALBUMIN 4.1 2.4* 2.4*    TUMOR MARKERS: No results for input(s):  AFPTM, CEA, CA199, CHROMGRNA in the last 8760 hours.  Assessment and Plan:   49 year old with evidence of appendicitis.  Recent CT demonstrates a fluid collection in the right lower quadrant which is indeterminate but cannot exclude a developing abscess collection.  Request for CT-guided aspiration or drainage.  Small fluid collection is small but there appears to be a small percutaneous window.  Plan for CT-guided aspiration or drainage of the small collection.  Risks and benefits discussed with the patient including bleeding, infection, damage to adjacent structures, bowel perforation/fistula connection, and sepsis.  All of the patient's questions were answered, patient is agreeable to proceed. Consent signed and in chart.   Thank you for this interesting consult.  I greatly enjoyed meeting Bri R Hourihan and look forward to participating in their care.  A  copy of this report was sent to the requesting provider on this date.  Electronically Signed: Burman Riis, MD 06/26/2019, 3:48 PM

## 2019-06-27 LAB — BASIC METABOLIC PANEL
Anion gap: 9 (ref 5–15)
BUN: 8 mg/dL (ref 6–20)
CO2: 28 mmol/L (ref 22–32)
Calcium: 8.1 mg/dL — ABNORMAL LOW (ref 8.9–10.3)
Chloride: 101 mmol/L (ref 98–111)
Creatinine, Ser: 0.64 mg/dL (ref 0.44–1.00)
GFR calc Af Amer: >60 mL/min (ref >60)
GFR calc non Af Amer: >60 mL/min (ref >60)
Glucose, Bld: 174 mg/dL — ABNORMAL HIGH (ref 70–99)
Potassium: 2.9 mmol/L — ABNORMAL LOW (ref 3.5–5.1)
Sodium: 138 mmol/L (ref 135–145)

## 2019-06-27 LAB — PHOSPHORUS: Phosphorus: 3.9 mg/dL (ref 2.5–4.6)

## 2019-06-27 LAB — GLUCOSE, CAPILLARY
Glucose-Capillary: 189 mg/dL — ABNORMAL HIGH (ref 70–99)
Glucose-Capillary: 170 mg/dL — ABNORMAL HIGH (ref 70–99)
Glucose-Capillary: 167 mg/dL — ABNORMAL HIGH (ref 70–99)
Glucose-Capillary: 188 mg/dL — ABNORMAL HIGH (ref 70–99)

## 2019-06-27 LAB — MAGNESIUM: Magnesium: 2 mg/dL (ref 1.7–2.4)

## 2019-06-27 MED ORDER — TRACE MINERALS CU-MN-SE-ZN 300-55-60-3000 MCG/ML IV SOLN
83.0000 mL/h | INTRAVENOUS | Status: AC
  Filled 2019-06-27: qty 1992

## 2019-06-27 MED ORDER — POTASSIUM CHLORIDE CRYS ER 20 MEQ PO TBCR
40.0000 meq | EXTENDED_RELEASE_TABLET | ORAL | Status: AC
  Administered 2019-06-27 (×2): 40 meq via ORAL
  Filled 2019-06-27 (×2): qty 2

## 2019-06-27 MED ORDER — FAT EMULSION PLANT BASED 20 % IV EMUL
250.0000 mL | INTRAVENOUS | Status: AC
  Administered 2019-06-27: 250 mL via INTRAVENOUS
  Filled 2019-06-27: qty 250

## 2019-06-27 MED ORDER — SODIUM CHLORIDE 0.9 % IV SOLN
INTRAVENOUS | Status: DC | PRN
  Administered 2019-06-27: 250 mL via INTRAVENOUS

## 2019-06-27 NOTE — Plan of Care (Signed)
Continuing with plan of care. 

## 2019-06-27 NOTE — Consult Note (Signed)
PHARMACY - TOTAL PARENTERAL NUTRITION CONSULT NOTE   Indication: Inadequate oral intake related to acute illness as evidenced by per patient/family report, meal completion < 25%  Patient Measurements: Height: 5\' 10"  (177.8 cm) Weight: 107.8 kg (237 lb 9.6 oz) IBW/kg (Calculated) : 68.5 TPN AdjBW (KG): 74.6 Body mass index is 34.09 kg/m.  Assessment: 49 year old patient with acute appendicitis with phlegmonous process and severe inflammation of the cecum and terminal ileum. Pt reports that the last good meal she ate was on Saturday 06/19/19. CT shows Dilated small bowel loops throughout upper and mid abdomen with decompressed and mildly thickened bowel distal small bowel loops, favoring ileus.  Glucose / Insulin: BG controlled on SSI Electrolytes: K 2.8, other electrolytes WNL Renal: SCr<1, controlled LFTs / TGs: LFTs wnl, baseline TG 261 Prealbumin / albumin: albumin 2.4   Central access: 06/25/19 TPN start date: 06/25/19  Nutritional Goals (per RD recommendation on 06/25/19): kCal: 2000 - 2300, Protein: 100 - 115 g/day, Fluid: >2L/day Goal TPN rate is 83 mL/hr (provides 100 g of protein and 2252 kcals per day)  Current Nutrition:  Full liquids  Plan:  Continue Clinimix E 5/20 TPN at 83 mL/hr Continue 20% ILE at 20 mL/hr x 12 hours Electrolytes in TPN: 32mEq/L of Na, 65mEq/L of K, 46mEq/L of Ca, 64mEq/L of Mg, and 26mmol/L of Phos. Cl:Ac ratio 1:1 Add standard MVI and trace elements, thiamine 100 mg daily x 3 days (LAST DAY)  to TPN Initiate Sensitive ACHS SSI and adjust as needed  Potassium 40 mEq PO x 2, patient will also receive 50 mEq/L in TPN Monitor TPN labs on Mon/Thurs when stabilized but daily until that occurs  Tawnya Crook, PharmD 06/27/2019,9:29 AM

## 2019-06-27 NOTE — Progress Notes (Signed)
[place] Hospital Day(s): 7.   Post op day(s):  Marland Kitchen   Interval History: Patient seen and examined, no acute events or new complaints overnight. Patient reports continued feeling with tightness.  She started having sharp pain on the right side after the procedure.  The pain does not radiate to other part of body.  There is no pain on the left side of the abdomen.  Denies nausea or vomiting.  Denies passing gas or stool.  Successful percutaneous drainage of intra-abdominal abscess on yesterday.  Vital signs in last 24 hours: [min-max] current  Temp:  [98 F (36.7 C)-99.5 F (37.5 C)] 98 F (36.7 C) (05/23 0415) Pulse Rate:  [78-99] 79 (05/23 0415) Resp:  [15-26] 20 (05/23 0415) BP: (89-146)/(60-82) 121/60 (05/23 0415) SpO2:  [95 %-100 %] 97 % (05/23 0415) Weight:  [107.8 kg] 107.8 kg (05/23 0415)     Height: 5\' 10"  (177.8 cm) Weight: 107.8 kg BMI (Calculated): 34.09   Physical Exam:  Constitutional: alert, cooperative and no distress  Respiratory: breathing non-labored at rest  Cardiovascular: regular rate and sinus rhythm  Gastrointestinal: soft, moderate-tender, and mild-distended  Labs:  CBC Latest Ref Rng & Units 06/25/2019 06/23/2019 06/21/2019  WBC 4.0 - 10.5 K/uL 11.0(H) 13.2(H) 16.9(H)  Hemoglobin 12.0 - 15.0 g/dL 11.2(L) 12.7 12.4  Hematocrit 36.0 - 46.0 % 33.3(L) 38.2 35.8(L)  Platelets 150 - 400 K/uL 274 259 214   CMP Latest Ref Rng & Units 06/27/2019 06/26/2019 06/25/2019  Glucose 70 - 99 mg/dL 174(H) 185(H) 74  BUN 6 - 20 mg/dL 8 <5(L) <5(L)  Creatinine 0.44 - 1.00 mg/dL 0.64 0.63 0.68  Sodium 135 - 145 mmol/L 138 137 138  Potassium 3.5 - 5.1 mmol/L 2.9(L) 3.1(L) 2.8(L)  Chloride 98 - 111 mmol/L 101 103 103  CO2 22 - 32 mmol/L 28 27 23   Calcium 8.9 - 10.3 mg/dL 8.1(L) 8.2(L) 8.1(L)  Total Protein 6.5 - 8.1 g/dL - - 6.0(L)  Total Bilirubin 0.3 - 1.2 mg/dL - - 1.6(H)  Alkaline Phos 38 - 126 U/L - - 107  AST 15 - 41 U/L - - 16  ALT 0 - 44 U/L - - 23     Imaging studies: I personally evaluated the CT scan of the CT-guided drainage of peritoneal abscess.  Chest x-ray with placement of catheter.   Assessment/Plan:  49 y.o.femalewith acute appendicitis with abscess s/p CT-guided percutaneous drainage.  Patient tolerated procedure well.  Continue with stable are assigned.  There is no fever.  There is no tachycardia.  Pain on the right side of the abdomen is expected after the procedure.  Patient will need to continue with IV antibiotic therapy and TPN.  We will keep her on clear liquid diet until the distention decrease and she continue passing gas.  Patient encouraged to ambulate.  We will continue with DVT prophylaxis.   [name], MD

## 2019-06-28 LAB — DIFFERENTIAL
Abs Immature Granulocytes: 0.34 10*3/uL — ABNORMAL HIGH (ref 0.00–0.07)
Basophils Absolute: 0.1 10*3/uL (ref 0.0–0.1)
Basophils Relative: 0 %
Eosinophils Absolute: 0.1 10*3/uL (ref 0.0–0.5)
Eosinophils Relative: 1 %
Immature Granulocytes: 3 %
Lymphocytes Relative: 13 %
Lymphs Abs: 1.4 10*3/uL (ref 0.7–4.0)
Monocytes Absolute: 0.8 10*3/uL (ref 0.1–1.0)
Monocytes Relative: 7 %
Neutro Abs: 8.4 10*3/uL — ABNORMAL HIGH (ref 1.7–7.7)
Neutrophils Relative %: 76 %

## 2019-06-28 LAB — GLUCOSE, CAPILLARY
Glucose-Capillary: 156 mg/dL — ABNORMAL HIGH (ref 70–99)
Glucose-Capillary: 158 mg/dL — ABNORMAL HIGH (ref 70–99)
Glucose-Capillary: 128 mg/dL — ABNORMAL HIGH (ref 70–99)
Glucose-Capillary: 144 mg/dL — ABNORMAL HIGH (ref 70–99)

## 2019-06-28 LAB — TRIGLYCERIDES: Triglycerides: 264 mg/dL — ABNORMAL HIGH (ref <150)

## 2019-06-28 LAB — CBC
HCT: 30.8 % — ABNORMAL LOW (ref 36.0–46.0)
Hemoglobin: 10.3 g/dL — ABNORMAL LOW (ref 12.0–15.0)
MCH: 28.1 pg (ref 26.0–34.0)
MCHC: 33.4 g/dL (ref 30.0–36.0)
MCV: 84.2 fL (ref 80.0–100.0)
Platelets: 305 10*3/uL (ref 150–400)
RBC: 3.66 MIL/uL — ABNORMAL LOW (ref 3.87–5.11)
RDW: 14.9 % (ref 11.5–15.5)
WBC: 11.1 10*3/uL — ABNORMAL HIGH (ref 4.0–10.5)
nRBC: 0 % (ref 0.0–0.2)

## 2019-06-28 LAB — COMPREHENSIVE METABOLIC PANEL
ALT: 23 U/L (ref 0–44)
AST: 22 U/L (ref 15–41)
Albumin: 2 g/dL — ABNORMAL LOW (ref 3.5–5.0)
Alkaline Phosphatase: 84 U/L (ref 38–126)
Anion gap: 10 (ref 5–15)
BUN: 14 mg/dL (ref 6–20)
CO2: 27 mmol/L (ref 22–32)
Calcium: 7.9 mg/dL — ABNORMAL LOW (ref 8.9–10.3)
Chloride: 102 mmol/L (ref 98–111)
Creatinine, Ser: 0.5 mg/dL (ref 0.44–1.00)
GFR calc Af Amer: >60 mL/min (ref >60)
GFR calc non Af Amer: >60 mL/min (ref >60)
Glucose, Bld: 166 mg/dL — ABNORMAL HIGH (ref 70–99)
Potassium: 3.2 mmol/L — ABNORMAL LOW (ref 3.5–5.1)
Sodium: 139 mmol/L (ref 135–145)
Total Bilirubin: 0.5 mg/dL (ref 0.3–1.2)
Total Protein: 5.7 g/dL — ABNORMAL LOW (ref 6.5–8.1)

## 2019-06-28 LAB — MAGNESIUM: Magnesium: 2.2 mg/dL (ref 1.7–2.4)

## 2019-06-28 LAB — PHOSPHORUS: Phosphorus: 4 mg/dL (ref 2.5–4.6)

## 2019-06-28 LAB — PREALBUMIN: Prealbumin: 5.3 mg/dL — ABNORMAL LOW (ref 18–38)

## 2019-06-28 MED ORDER — POTASSIUM CHLORIDE 10 MEQ/50ML IV SOLN
10.0000 meq | INTRAVENOUS | Status: AC
  Administered 2019-06-28 (×4): 10 meq via INTRAVENOUS
  Filled 2019-06-28 (×4): qty 50

## 2019-06-28 MED ORDER — FAT EMULSION PLANT BASED 20 % IV EMUL
250.0000 mL | INTRAVENOUS | Status: AC
  Administered 2019-06-28: 250 mL via INTRAVENOUS
  Filled 2019-06-28: qty 250

## 2019-06-28 MED ORDER — TRACE MINERALS CU-MN-SE-ZN 300-55-60-3000 MCG/ML IV SOLN
83.0000 mL/h | INTRAVENOUS | Status: AC
  Filled 2019-06-28: qty 1992

## 2019-06-28 NOTE — Progress Notes (Signed)
[place] Hospital Day(s): 8.   Post op day(s):  Marland Kitchen   Interval History: Patient seen and examined, no acute events or new complaints overnight. Patient reports continue with bloating.  She denies nausea or vomiting.  She reports she had 2 bowel movement yesterday.  To report passing gas.  Denies fever or chills.  Pain localized to the right side of the abdomen.  No pain on the left side of the abdomen.  No pain radiation.  Aggravating factor is applying pressure.  Alleviating factor is pain medications.  Vital signs in last 24 hours: [min-max] current  Temp:  [98.4 F (36.9 C)-99.5 F (37.5 C)] 99.5 F (37.5 C) (05/24 1226) Pulse Rate:  [87-96] 96 (05/24 1226) Resp:  [16-18] 16 (05/24 1226) BP: (124-132)/(71-80) 132/72 (05/24 1226) SpO2:  [95 %-97 %] 95 % (05/24 1226) Weight:  [109 kg] 109 kg (05/24 0500)     Height: 5\' 10"  (177.8 cm) Weight: 109 kg BMI (Calculated): 34.48   Physical Exam:  Constitutional: alert, cooperative and no distress  Respiratory: breathing non-labored at rest  Cardiovascular: regular rate and sinus rhythm  Gastrointestinal: soft, moderate tender, and mild-distended  Labs:  CBC Latest Ref Rng & Units 06/28/2019 06/25/2019 06/23/2019  WBC 4.0 - 10.5 K/uL 11.1(H) 11.0(H) 13.2(H)  Hemoglobin 12.0 - 15.0 g/dL 10.3(L) 11.2(L) 12.7  Hematocrit 36.0 - 46.0 % 30.8(L) 33.3(L) 38.2  Platelets 150 - 400 K/uL 305 274 259   CMP Latest Ref Rng & Units 06/28/2019 06/27/2019 06/26/2019  Glucose 70 - 99 mg/dL 166(H) 174(H) 185(H)  BUN 6 - 20 mg/dL 14 8 <5(L)  Creatinine 0.44 - 1.00 mg/dL 0.50 0.64 0.63  Sodium 135 - 145 mmol/L 139 138 137  Potassium 3.5 - 5.1 mmol/L 3.2(L) 2.9(L) 3.1(L)  Chloride 98 - 111 mmol/L 102 101 103  CO2 22 - 32 mmol/L 27 28 27   Calcium 8.9 - 10.3 mg/dL 7.9(L) 8.1(L) 8.2(L)  Total Protein 6.5 - 8.1 g/dL 5.7(L) - -  Total Bilirubin 0.3 - 1.2 mg/dL 0.5 - -  Alkaline Phos 38 - 126 U/L 84 - -  AST 15 - 41 U/L 22 - -  ALT 0 - 44 U/L  23 - -    Imaging studies: No new pertinent imaging studies   Assessment/Plan:  49 y.o.femalewith acute appendicitis with abscess s/p CT-guided percutaneous drainage.  Patient without clinical deterioration.  Stable condition.  No worsening abdominal pain.  No worsening abdominal distention.  No nausea or vomiting.  Significant to comfortable with persistent sharp pains, intermittent.  We will continue with pain management.  Will wait for reactive ileus to improve.  We will continue with clear liquid diet and TPN.  Encouraged the patient to ambulate.  [name], MD

## 2019-06-28 NOTE — Consult Note (Signed)
PHARMACY - TOTAL PARENTERAL NUTRITION CONSULT NOTE   Indication: Inadequate oral intake related to acute illness as evidenced by per patient/family report, meal completion < 25%  Patient Measurements: Height: 5\' 10"  (177.8 cm) Weight: 109 kg (240 lb 4.8 oz) IBW/kg (Calculated) : 68.5 TPN AdjBW (KG): 74.6 Body mass index is 34.48 kg/m.  Assessment: 49 year old patient with acute appendicitis with phlegmonous process and severe inflammation of the cecum and terminal ileum. Pt reports that the last good meal she ate was on Saturday 06/19/19. CT shows Dilated small bowel loops throughout upper and mid abdomen with decompressed and mildly thickened bowel distal small bowel loops, favoring ileus.  Glucose / Insulin: BG controlled on SSI ACHS.  SSI/24 hrs= 6 units Electrolytes: K 3.2, other electrolytes WNL Renal: SCr<1, controlled LFTs / TGs: LFTs wnl, baseline TG 261.  TG 264 Prealbumin / albumin: albumin 2.0   Central access: 06/25/19 TPN start date: 06/25/19  Nutritional Goals (per RD recommendation on 06/25/19): kCal: 2000 - 2300, Protein: 100 - 115 g/day, Fluid: >2L/day Goal TPN rate is 83 mL/hr (provides 100 g of protein and 2252 kcals per day)  Current Nutrition:  Clear liquids  Plan:  Continue Clinimix E 5/20 TPN at 83 mL/hr Continue 20% ILE at 20 mL/hr x 12 hours Electrolytes in TPN: 69mEq/L of Na, 78mEq/L of K, 16mEq/L of Ca, 71mEq/L of Mg, and 78mmol/L of Phos. Cl:Ac ratio 1:1 Add standard MVI and trace elements to TPN Initiate Sensitive ACHS SSI and adjust as needed  K 3.2, order Potassium Chloride 10 meq IV x 4 bags.  Monitor TPN labs on Mon/Thurs when stabilized but daily until that occurs  Per dietician- ?possibly wean tomorrow 5/25 if tolerates PO  Kerri Sullivan A, PharmD 06/28/2019,2:40 PM

## 2019-06-29 LAB — BASIC METABOLIC PANEL
Anion gap: 10 (ref 5–15)
BUN: 12 mg/dL (ref 6–20)
CO2: 28 mmol/L (ref 22–32)
Calcium: 8.1 mg/dL — ABNORMAL LOW (ref 8.9–10.3)
Chloride: 100 mmol/L (ref 98–111)
Creatinine, Ser: 0.53 mg/dL (ref 0.44–1.00)
GFR calc Af Amer: >60 mL/min (ref >60)
GFR calc non Af Amer: >60 mL/min (ref >60)
Glucose, Bld: 160 mg/dL — ABNORMAL HIGH (ref 70–99)
Potassium: 3.6 mmol/L (ref 3.5–5.1)
Sodium: 138 mmol/L (ref 135–145)

## 2019-06-29 LAB — GLUCOSE, CAPILLARY
Glucose-Capillary: 172 mg/dL — ABNORMAL HIGH (ref 70–99)
Glucose-Capillary: 166 mg/dL — ABNORMAL HIGH (ref 70–99)
Glucose-Capillary: 132 mg/dL — ABNORMAL HIGH (ref 70–99)
Glucose-Capillary: 128 mg/dL — ABNORMAL HIGH (ref 70–99)

## 2019-06-29 MED ORDER — TRACE MINERALS CU-MN-SE-ZN 300-55-60-3000 MCG/ML IV SOLN
83.0000 mL/h | INTRAVENOUS | Status: AC
  Filled 2019-06-29: qty 1992

## 2019-06-29 MED ORDER — FAT EMULSION PLANT BASED 20 % IV EMUL
250.0000 mL | INTRAVENOUS | Status: AC
  Administered 2019-06-29: 250 mL via INTRAVENOUS
  Filled 2019-06-29: qty 250

## 2019-06-29 NOTE — Consult Note (Signed)
PHARMACY - TOTAL PARENTERAL NUTRITION CONSULT NOTE   Indication: Inadequate oral intake related to acute illness as evidenced by per patient/family report, meal completion < 25%  Patient Measurements: Height: 5\' 10"  (177.8 cm) Weight: 106.9 kg (235 lb 10.8 oz) IBW/kg (Calculated) : 68.5 TPN AdjBW (KG): 74.6 Body mass index is 33.82 kg/m.  Assessment: 49 year old patient with acute appendicitis with phlegmonous process and severe inflammation of the cecum and terminal ileum. Pt reports that the last good meal she ate was on Saturday 06/19/19. CT shows Dilated small bowel loops throughout upper and mid abdomen with decompressed and mildly thickened bowel distal small bowel loops, favoring ileus.  Glucose / Insulin: BG 128-172 on SSI ACHS.  SSI/24 hrs= 3 units Electrolytes: WNL Renal: SCr<1, controlled LFTs / TGs: LFTs wnl, baseline TG 261.  TG 264 Prealbumin / albumin: albumin 2.0   Central access: 06/25/19 TPN start date: 06/25/19  Nutritional Goals (per RD recommendation on 06/25/19): kCal: 2000 - 2300, Protein: 100 - 115 g/day, Fluid: >2L/day Goal TPN rate is 83 mL/hr (provides 100 g of protein and 2252 kcals per day)  Current Nutrition:  Clear liquids + TPN  Plan:  Continue Clinimix E 5/20 TPN at 83 mL/hr Continue 20% ILE at 20 mL/hr x 12 hours Electrolytes in TPN: 40mEq/L of Na, 30mEq/L of K, 33mEq/L of Ca, 76mEq/L of Mg, and 46mmol/L of Phos. Cl:Ac ratio 1:1 Add standard MVI and trace elements to TPN  Sensitive ACHS SSI and adjust as needed   Monitor TPN labs on Mon/Thurs when stabilized but daily until that occurs   Kerri Sullivan A, PharmD 06/29/2019,12:54 PM

## 2019-06-30 LAB — BASIC METABOLIC PANEL
Anion gap: 9 (ref 5–15)
BUN: 12 mg/dL (ref 6–20)
CO2: 29 mmol/L (ref 22–32)
Calcium: 8.1 mg/dL — ABNORMAL LOW (ref 8.9–10.3)
Chloride: 99 mmol/L (ref 98–111)
Creatinine, Ser: 0.51 mg/dL (ref 0.44–1.00)
GFR calc Af Amer: >60 mL/min (ref >60)
GFR calc non Af Amer: >60 mL/min (ref >60)
Glucose, Bld: 126 mg/dL — ABNORMAL HIGH (ref 70–99)
Potassium: 3.8 mmol/L (ref 3.5–5.1)
Sodium: 137 mmol/L (ref 135–145)

## 2019-06-30 LAB — AEROBIC/ANAEROBIC CULTURE W GRAM STAIN (SURGICAL/DEEP WOUND)

## 2019-06-30 LAB — GLUCOSE, CAPILLARY
Glucose-Capillary: 139 mg/dL — ABNORMAL HIGH (ref 70–99)
Glucose-Capillary: 116 mg/dL — ABNORMAL HIGH (ref 70–99)
Glucose-Capillary: 112 mg/dL — ABNORMAL HIGH (ref 70–99)
Glucose-Capillary: 120 mg/dL — ABNORMAL HIGH (ref 70–99)

## 2019-06-30 MED ORDER — TRACE MINERALS CU-MN-SE-ZN 300-55-60-3000 MCG/ML IV SOLN
83.0000 mL/h | INTRAVENOUS | Status: AC
  Filled 2019-06-30: qty 1992

## 2019-06-30 MED ORDER — FAT EMULSION PLANT BASED 20 % IV EMUL
250.0000 mL | INTRAVENOUS | Status: AC
  Administered 2019-06-30: 250 mL via INTRAVENOUS
  Filled 2019-06-30: qty 250

## 2019-06-30 NOTE — Progress Notes (Signed)
[place] Hospital Day(s): 10.   Post op day(s):  Marland Kitchen   Interval History: Patient seen and examined, no acute events or new complaints overnight. Patient reports feeling better today.  She reported that she has had 2 bowel movement today.  She denies nausea or vomiting.  She reported that she has been tolerating better to clear liquids.  She reported of the pain has improved significantly.  Vital signs in last 24 hours: [min-max] current  Temp:  [97.9 F (36.6 C)-98.3 F (36.8 C)] 98.3 F (36.8 C) (05/26 0431) Pulse Rate:  [75-87] 83 (05/26 0431) Resp:  [15-20] 20 (05/26 0431) BP: (137-141)/(70-79) 137/78 (05/26 0431) SpO2:  [97 %-98 %] 98 % (05/26 0431) Weight:  [108.4 kg] 108.4 kg (05/26 0431)     Height: 5\' 10"  (177.8 cm) Weight: 108.4 kg BMI (Calculated): 34.29   Physical Exam:  Constitutional: alert, cooperative and no distress  Respiratory: breathing non-labored at rest  Cardiovascular: regular rate and sinus rhythm  Gastrointestinal: soft, mild-tender, and mild-distended  Labs:  CBC Latest Ref Rng & Units 06/28/2019 06/25/2019 06/23/2019  WBC 4.0 - 10.5 K/uL 11.1(H) 11.0(H) 13.2(H)  Hemoglobin 12.0 - 15.0 g/dL 10.3(L) 11.2(L) 12.7  Hematocrit 36.0 - 46.0 % 30.8(L) 33.3(L) 38.2  Platelets 150 - 400 K/uL 305 274 259   CMP Latest Ref Rng & Units 06/30/2019 06/29/2019 06/28/2019  Glucose 70 - 99 mg/dL 126(H) 160(H) 166(H)  BUN 6 - 20 mg/dL 12 12 14   Creatinine 0.44 - 1.00 mg/dL 0.51 0.53 0.50  Sodium 135 - 145 mmol/L 137 138 139  Potassium 3.5 - 5.1 mmol/L 3.8 3.6 3.2(L)  Chloride 98 - 111 mmol/L 99 100 102  CO2 22 - 32 mmol/L 29 28 27   Calcium 8.9 - 10.3 mg/dL 8.1(L) 8.1(L) 7.9(L)  Total Protein 6.5 - 8.1 g/dL - - 5.7(L)  Total Bilirubin 0.3 - 1.2 mg/dL - - 0.5  Alkaline Phos 38 - 126 U/L - - 84  AST 15 - 41 U/L - - 22  ALT 0 - 44 U/L - - 23    Imaging studies: No new pertinent imaging studies   Assessment/Plan:  49 y.o.femalewith acute  appendicitis withabscess s/p CT-guided percutaneous drainage.  Patient today feeling better.  She tolerated clear liquid diet better without pain.  I will advance her diet to full liquids and assess for toleration.  I will continue with IV antibiotic therapy.  I will continue with TPN same rate.  I encouraged the patient to ambulate.  We will continue with DVT prophylaxis.  [name], MD

## 2019-06-30 NOTE — Consult Note (Signed)
PHARMACY - TOTAL PARENTERAL NUTRITION CONSULT NOTE   Indication: Inadequate oral intake related to acute illness as evidenced by per patient/family report, meal completion < 25%  Patient Measurements: Height: 5\' 10"  (177.8 cm) Weight: 108.4 kg (239 lb) IBW/kg (Calculated) : 68.5 TPN AdjBW (KG): 74.6 Body mass index is 34.29 kg/m.  Assessment: 49 year old patient with acute appendicitis with phlegmonous process and severe inflammation of the cecum and terminal ileum. Pt reports that the last good meal she ate was on Saturday 06/19/19. CT shows Dilated small bowel loops throughout upper and mid abdomen with decompressed and mildly thickened bowel distal small bowel loops, favoring ileus.  Glucose / Insulin: BG 128-172 on SSI ACHS.  SSI/24 hrs= 5 units Electrolytes: WNL Renal: SCr<1, controlled LFTs / TGs: LFTs wnl, baseline TG 261.  TG 264 (5/24) Prealbumin / albumin: albumin 2.0   Central access: 06/25/19 TPN start date: 06/25/19  Nutritional Goals (per RD recommendation on 06/25/19): kCal: 2000 - 2300, Protein: 100 - 115 g/day, Fluid: >2L/day Goal TPN rate is 83 mL/hr (provides 100 g of protein and 2252 kcals per day)  Current Nutrition:  Full liquids + TPN  Plan:  Continue Clinimix E 5/20 TPN at 83 mL/hr Continue 20% ILE at 20 mL/hr x 12 hours Electrolytes in TPN: 69mEq/L of Na, 33mEq/L of K, 77mEq/L of Ca, 71mEq/L of Mg, and 61mmol/L of Phos. Cl:Ac ratio 1:1 Add standard MVI and trace elements to TPN  Sensitive ACHS SSI and adjust as needed   Monitor TPN labs on Mon/Thurs    Lu Duffel, PharmD, BCPS Clinical Pharmacist 06/30/2019 11:36 AM

## 2019-06-30 NOTE — Progress Notes (Signed)
[place] Hospital Day(s): 9.   Post op day(s):  Marland Kitchen   Interval History: Patient seen and examined, no acute events or new complaints overnight. Patient reports no change on symptoms. Continue with pain on right side of the abdomen. Pain does not radiates. Pain controlled with pain medications. No aggravating factors. No nausea or vomiting.   Vital signs in last 24 hours: [min-max] current  Temp:  98.1 Pulse Rate:  75 Resp:  [15-20] 20 (05/26 0431) BP: 37/79 SpO2:  98 %  Weight:  [108.4 kg] 108.4 kg (05/26 0431)     Height: 5\' 10"  (177.8 cm) Weight: 108.4 kg BMI (Calculated): 34.29   Physical Exam:  Constitutional: alert, cooperative and no distress  Respiratory: breathing non-labored at rest  Cardiovascular: regular rate and sinus rhythm  Gastrointestinal: soft, mild-tender, and mild-distended. Drain in place.   Labs:  CBC Latest Ref Rng & Units 06/28/2019 06/25/2019 06/23/2019  WBC 4.0 - 10.5 K/uL 11.1(H) 11.0(H) 13.2(H)  Hemoglobin 12.0 - 15.0 g/dL 10.3(L) 11.2(L) 12.7  Hematocrit 36.0 - 46.0 % 30.8(L) 33.3(L) 38.2  Platelets 150 - 400 K/uL 305 274 259   CMP Latest Ref Rng & Units 06/30/2019 06/29/2019 06/28/2019  Glucose 70 - 99 mg/dL 126(H) 160(H) 166(H)  BUN 6 - 20 mg/dL 12 12 14   Creatinine 0.44 - 1.00 mg/dL 0.51 0.53 0.50  Sodium 135 - 145 mmol/L 137 138 139  Potassium 3.5 - 5.1 mmol/L 3.8 3.6 3.2(L)  Chloride 98 - 111 mmol/L 99 100 102  CO2 22 - 32 mmol/L 29 28 27   Calcium 8.9 - 10.3 mg/dL 8.1(L) 8.1(L) 7.9(L)  Total Protein 6.5 - 8.1 g/dL - - 5.7(L)  Total Bilirubin 0.3 - 1.2 mg/dL - - 0.5  Alkaline Phos 38 - 126 U/L - - 84  AST 15 - 41 U/L - - 22  ALT 0 - 44 U/L - - 23    Imaging studies: No new pertinent imaging studies   Assessment/Plan:  49 y.o.femalewith acute appendicitis withabscess s/p CT-guided percutaneous drainage.  Patient without clinical deterioration. Not too much appetite yet. Drinking small amount of fluid, no nausea or  vomiting. Passing gas. Will continue with clear liquid diet, TPN, IV abx. Encourage patient to ambulate. Will continue with DVT prophylaxis.   [name], MD

## 2019-07-01 LAB — CBC
HCT: 33.3 % — ABNORMAL LOW (ref 36.0–46.0)
Hemoglobin: 9.7 g/dL — ABNORMAL LOW (ref 12.0–15.0)
MCH: 28.9 pg (ref 26.0–34.0)
MCHC: 29.1 g/dL — ABNORMAL LOW (ref 30.0–36.0)
MCV: 99.1 fL (ref 80.0–100.0)
Platelets: 391 10*3/uL (ref 150–400)
RBC: 3.36 MIL/uL — ABNORMAL LOW (ref 3.87–5.11)
RDW: 15.5 % (ref 11.5–15.5)
WBC: 11 10*3/uL — ABNORMAL HIGH (ref 4.0–10.5)
nRBC: 0 % (ref 0.0–0.2)

## 2019-07-01 LAB — COMPREHENSIVE METABOLIC PANEL
ALT: 26 U/L (ref 0–44)
AST: 22 U/L (ref 15–41)
Albumin: 2.1 g/dL — ABNORMAL LOW (ref 3.5–5.0)
Alkaline Phosphatase: 144 U/L — ABNORMAL HIGH (ref 38–126)
Anion gap: 9 (ref 5–15)
BUN: 11 mg/dL (ref 6–20)
CO2: 28 mmol/L (ref 22–32)
Calcium: 8.3 mg/dL — ABNORMAL LOW (ref 8.9–10.3)
Chloride: 101 mmol/L (ref 98–111)
Creatinine, Ser: 0.6 mg/dL (ref 0.44–1.00)
GFR calc Af Amer: >60 mL/min (ref >60)
GFR calc non Af Amer: >60 mL/min (ref >60)
Glucose, Bld: 121 mg/dL — ABNORMAL HIGH (ref 70–99)
Potassium: 4.2 mmol/L (ref 3.5–5.1)
Sodium: 138 mmol/L (ref 135–145)
Total Bilirubin: 0.4 mg/dL (ref 0.3–1.2)
Total Protein: 6.5 g/dL (ref 6.5–8.1)

## 2019-07-01 LAB — PHOSPHORUS: Phosphorus: 5.1 mg/dL — ABNORMAL HIGH (ref 2.5–4.6)

## 2019-07-01 LAB — GLUCOSE, CAPILLARY
Glucose-Capillary: 148 mg/dL — ABNORMAL HIGH (ref 70–99)
Glucose-Capillary: 131 mg/dL — ABNORMAL HIGH (ref 70–99)
Glucose-Capillary: 120 mg/dL — ABNORMAL HIGH (ref 70–99)
Glucose-Capillary: 121 mg/dL — ABNORMAL HIGH (ref 70–99)

## 2019-07-01 LAB — MAGNESIUM: Magnesium: 2.3 mg/dL (ref 1.7–2.4)

## 2019-07-01 MED ORDER — ENSURE MAX PROTEIN PO LIQD
11.0000 [oz_av] | Freq: Two times a day (BID) | ORAL | Status: DC
  Administered 2019-07-01 – 2019-07-03 (×4): 11 [oz_av] via ORAL
  Filled 2019-07-01: qty 330

## 2019-07-01 MED ORDER — FAT EMULSION PLANT BASED 20 % IV EMUL
250.0000 mL | INTRAVENOUS | Status: AC
  Administered 2019-07-01: 250 mL via INTRAVENOUS
  Filled 2019-07-01: qty 250

## 2019-07-01 MED ORDER — ENSURE MAX PROTEIN PO LIQD: 11.0000 [oz_av] | Freq: Every day | ORAL | Status: DC

## 2019-07-01 MED ORDER — TRACE MINERALS CU-MN-SE-ZN 300-55-60-3000 MCG/ML IV SOLN
INTRAVENOUS | Status: AC
  Filled 2019-07-01: qty 960

## 2019-07-01 NOTE — Consult Note (Signed)
PHARMACY - TOTAL PARENTERAL NUTRITION CONSULT NOTE   Indication: Inadequate oral intake related to acute illness as evidenced by per patient/family report, meal completion < 25%  Patient Measurements: Height: 5\' 10"  (177.8 cm) Weight: 102.7 kg (226 lb 8 oz) IBW/kg (Calculated) : 68.5 TPN AdjBW (KG): 74.6 Body mass index is 32.5 kg/m.  Assessment: 49 year old patient with acute appendicitis with phlegmonous process and severe inflammation of the cecum and terminal ileum. Pt reports that the last good meal she ate was on Saturday 06/19/19. CT shows Dilated small bowel loops throughout upper and mid abdomen with decompressed and mildly thickened bowel distal small bowel loops, favoring ileus.  Glucose / Insulin: BG 112-148 on SSI ACHS.  SSI/24 hrs= 2 units Electrolytes: WNL Renal: SCr<1, controlled LFTs / TGs: LFTs wnl, baseline TG 261.  TG 264 (5/24) Prealbumin / albumin: albumin 2.0   Central access: 06/25/19 TPN start date: 06/25/19  Nutritional Goals (per RD recommendation on 06/25/19): kCal: 2000 - 2300, Protein: 100 - 115 g/day, Fluid: >2L/day Goal TPN rate is 83 mL/hr (provides 100 g of protein and 2252 kcals per day)  Current Nutrition:  Full liquids + TPN  Plan:  Reduce Clinimix E 5/20 TPN to 40 mL/hr Continue 20% ILE at 20 mL/hr x 12 hours Electrolytes in full rate TPN: 68mEq/L of Na, 51mEq/L of K, 42mEq/L of Ca, 44mEq/L of Mg, and 16mmol/L of Phos. Cl:Ac ratio 1:1  Add standard MVI and trace elements to TPN  Sensitive ACHS SSI and adjust as needed   Monitor TPN labs on Mon/Thurs    Lu Duffel, PharmD, BCPS Clinical Pharmacist 07/01/2019 2:37 PM

## 2019-07-01 NOTE — Progress Notes (Signed)
Nutrition Follow-up  DOCUMENTATION CODES:   Not applicable  INTERVENTION:  Plan is to reduce TPN to half rate today. Reduce to Clinimix E 5/20 at 40 mL/hr x 24 hrs. Continue 20% ILE 250 mL over 12 hours.  Provide Ensure Max Protein po BID, each supplement provides 150 kcal and 30 grams of protein.  NUTRITION DIAGNOSIS:   Inadequate oral intake related to acute illness as evidenced by per patient/family report, meal completion < 25%.  Resolving - diet being advanced and intake improving.  GOAL:   Patient will meet greater than or equal to 90% of their needs  Progressing.  MONITOR:   PO intake, Labs, Weight trends, Skin, I & O's, Other (Comment)(TPN)  REASON FOR ASSESSMENT:   Consult New TPN/TNA  ASSESSMENT:   49 year old patient with acute appendicitis with phlegmonous process and severe inflammation of the cecum and terminal ileum.   -Patient s/p placement of 10 Fr. drain in right anterior abdominal abscess on 5/22.  Patient was advanced to clear liquids on 5/22. Diet was advanced to full liquids on 5/26. Met with patient at bedside. She reports she is having some cramping with intake but overall is tolerating full liquids. She is taking intake slowly. She had some grits for breakfast. For lunch she had cream of chicken soup and fruit ice. Patient is amenable to drinking oral nutrition supplement between meals. Discussed options available. She reports she has bloating with intake of too many carbohydrates so wanted a lower carbohydrate option. Plan is for Ensure Max Protein.  Medications reviewed and include: Novolog 0-9 units TID, Novolog 0-5 units QHS, Protonix, Zosyn.  Labs reviewed: CBG 120-148, Phosphorus 5.1.  Discussed with MD via secure chat. Plan is to reduce TPN to 1/2 rate. Okay to order oral nutrition supplement for patient. Discussed with RN.  Diet Order:   Diet Order            Diet full liquid Room service appropriate? Yes; Fluid consistency: Thin  Diet  effective now             EDUCATION NEEDS:   Education needs have been addressed  Skin:  Skin Assessment: Reviewed RN Assessment  Last BM:  5/20- type 7  Height:   Ht Readings from Last 1 Encounters:  06/20/19 _0  (1.778 m)   Weight:   Wt Readings from Last 1 Encounters:  07/01/19 102.7 kg   Ideal Body Weight:  75.45 kg  BMI:  Body mass index is 32.5 kg/m.  Estimated Nutritional Needs:   Kcal:  2000-2300kcal/day  Protein:  100-115g/day  Fluid:  >2L/day  Jacklynn Barnacle, MS, RD, LDN Pager number available on Amion

## 2019-07-01 NOTE — Progress Notes (Signed)
[place] [facility] Hospital Day(s): 11.   Post op day(s):  Marland Kitchen   Interval History: Patient seen and examined, no acute events or new complaints overnight. Patient reports feeling a little bit better.  She reports she tolerated liquids this morning.  She denies nausea or vomiting.  She continue passing gas and having small episode of diarrhea.   Vital signs in last 24 hours: [min-max] current  Temp:  [98.1 F (36.7 C)-98.5 F (36.9 C)] 98.1 F (36.7 C) (05/27 0458) Pulse Rate:  [77-81] 77 (05/27 0458) Resp:  [16-20] 16 (05/27 0458) BP: (139-145)/(76-77) 145/77 (05/27 0458) SpO2:  [97 %] 97 % (05/27 0458) Weight:  [102.7 kg] 102.7 kg (05/27 0500)     Height: 5\' 10"  (177.8 cm) Weight: 102.7 kg BMI (Calculated): 32.5   Physical Exam:  Constitutional: alert, cooperative and no distress  Respiratory: breathing non-labored at rest  Cardiovascular: regular rate and sinus rhythm  Gastrointestinal: soft, mild-tender, and mild-distended.  Drain in place  Labs:  CBC Latest Ref Rng & Units 06/28/2019 06/25/2019 06/23/2019  WBC 4.0 - 10.5 K/uL 11.1(H) 11.0(H) 13.2(H)  Hemoglobin 12.0 - 15.0 g/dL 10.3(L) 11.2(L) 12.7  Hematocrit 36.0 - 46.0 % 30.8(L) 33.3(L) 38.2  Platelets 150 - 400 K/uL 305 274 259   CMP Latest Ref Rng & Units 07/01/2019 06/30/2019 06/29/2019  Glucose 70 - 99 mg/dL 121(H) 126(H) 160(H)  BUN 6 - 20 mg/dL 11 12 12   Creatinine 0.44 - 1.00 mg/dL 0.60 0.51 0.53  Sodium 135 - 145 mmol/L 138 137 138  Potassium 3.5 - 5.1 mmol/L 4.2 3.8 3.6  Chloride 98 - 111 mmol/L 101 99 100  CO2 22 - 32 mmol/L 28 29 28   Calcium 8.9 - 10.3 mg/dL 8.3(L) 8.1(L) 8.1(L)  Total Protein 6.5 - 8.1 g/dL 6.5 - -  Total Bilirubin 0.3 - 1.2 mg/dL 0.4 - -  Alkaline Phos 38 - 126 U/L 144(H) - -  AST 15 - 41 U/L 22 - -  ALT 0 - 44 U/L 26 - -    Imaging studies: No new pertinent imaging studies   Assessment/Plan:  49 y.o.femalewith acute appendicitis withabscess s/p CT-guided percutaneous  drainage.  Patient slowly improving.  She is slowly starting to tolerating very diet.  Her abdominal pain slightly to be more controlled.  Patient will need to continue with IV antibiotic therapy.  I will order a CBC to assess white blood cell count.  I will start weaning TPN.  I will keep her in full liquids.  I recommended her to have small meals more frequently.  Nutritionist to assess the need of nutritional supplements.  She is ambulating.  We will continue with DVT prophylaxis.  [name], MD

## 2019-07-02 LAB — GLUCOSE, CAPILLARY
Glucose-Capillary: 116 mg/dL — ABNORMAL HIGH (ref 70–99)
Glucose-Capillary: 120 mg/dL — ABNORMAL HIGH (ref 70–99)
Glucose-Capillary: 110 mg/dL — ABNORMAL HIGH (ref 70–99)

## 2019-07-02 LAB — PHOSPHORUS: Phosphorus: 5.3 mg/dL — ABNORMAL HIGH (ref 2.5–4.6)

## 2019-07-02 MED ORDER — AMOXICILLIN-POT CLAVULANATE 875-125 MG PO TABS
1.0000 | ORAL_TABLET | Freq: Two times a day (BID) | ORAL | Status: DC
  Administered 2019-07-02 – 2019-07-04 (×4): 1 via ORAL
  Filled 2019-07-02 (×4): qty 1

## 2019-07-02 NOTE — Progress Notes (Signed)
[place] Hospital Day(s): 12.   Post op day(s):  Marland Kitchen   Interval History: Patient seen and examined, no acute events or new complaints overnight. Patient reports feeling a little bit more comfortable.  Denies worsening abdominal pain.  Still not having great appetite but able to tolerate and eat most of her full liquid diet.  Continue passing gas.  Vital signs in last 24 hours: [min-max] current  Temp:  [98.2 F (36.8 C)-98.6 F (37 C)] 98.4 F (36.9 C) (05/28 1516) Pulse Rate:  [79-89] 85 (05/28 1516) Resp:  [15-16] 15 (05/28 1516) BP: (130-142)/(66-83) 142/83 (05/28 1516) SpO2:  [96 %-99 %] 99 % (05/28 1516) Weight:  [97.9 kg-100.5 kg] 97.9 kg (05/28 0500)     Height: 5\' 10"  (177.8 cm) Weight: 97.9 kg BMI (Calculated): 30.96   Physical Exam:  Constitutional: alert, cooperative and no distress  Respiratory: breathing non-labored at rest  Cardiovascular: regular rate and sinus rhythm  Gastrointestinal: soft, mild-tender, and mild-distended.  Drain in place  Labs:  CBC Latest Ref Rng & Units 07/01/2019 06/28/2019 06/25/2019  WBC 4.0 - 10.5 K/uL 11.0(H) 11.1(H) 11.0(H)  Hemoglobin 12.0 - 15.0 g/dL 9.7(L) 10.3(L) 11.2(L)  Hematocrit 36.0 - 46.0 % 33.3(L) 30.8(L) 33.3(L)  Platelets 150 - 400 K/uL 391 305 274   CMP Latest Ref Rng & Units 07/01/2019 06/30/2019 06/29/2019  Glucose 70 - 99 mg/dL 121(H) 126(H) 160(H)  BUN 6 - 20 mg/dL 11 12 12   Creatinine 0.44 - 1.00 mg/dL 0.60 0.51 0.53  Sodium 135 - 145 mmol/L 138 137 138  Potassium 3.5 - 5.1 mmol/L 4.2 3.8 3.6  Chloride 98 - 111 mmol/L 101 99 100  CO2 22 - 32 mmol/L 28 29 28   Calcium 8.9 - 10.3 mg/dL 8.3(L) 8.1(L) 8.1(L)  Total Protein 6.5 - 8.1 g/dL 6.5 - -  Total Bilirubin 0.3 - 1.2 mg/dL 0.4 - -  Alkaline Phos 38 - 126 U/L 144(H) - -  AST 15 - 41 U/L 22 - -  ALT 0 - 44 U/L 26 - -    Imaging studies: No new pertinent imaging studies   Assessment/Plan:  49 y.o.femalewith acute appendicitis withabscess s/p  CT-guided percutaneous drainage.  Patient recovering slowly.  No clinical deterioration.  She continues tolerating her full liquids.  Still not feeling the comfortable to advance her diet but eating enough full liquid to get her calories.  Also getting nutritional supplements.  Patient able to ambulate.  Will discontinue TPN.  We will continue with DVT prophylaxis.  We will continue with pain management.  [name], MD

## 2019-07-03 MED ORDER — METHOCARBAMOL 500 MG PO TABS
500.0000 mg | ORAL_TABLET | Freq: Four times a day (QID) | ORAL | Status: DC | PRN
  Administered 2019-07-03 – 2019-07-04 (×5): 500 mg via ORAL
  Filled 2019-07-03 (×5): qty 1

## 2019-07-03 NOTE — Progress Notes (Signed)
[place] Hospital Day(s): 13.   Post op day(s):  Marland Kitchen   Interval History: Patient seen and examined, no acute events or new complaints overnight. Patient reports having significant cramps in the right lower abdomen.  Denies nausea or vomiting.  Reports having a bowel movement yesterday.  Denies fever or chills.  Cramps does not radiate to a part of the body.  There is no alleviating or aggravating factor.  Vital signs in last 24 hours: [min-max] current  Temp:  [98.3 F (36.8 C)-98.7 F (37.1 C)] 98.3 F (36.8 C) (05/29 0518) Pulse Rate:  [83-85] 83 (05/29 0518) Resp:  [15-20] 20 (05/29 0518) BP: (117-142)/(69-83) 117/73 (05/29 0518) SpO2:  [94 %-99 %] 96 % (05/29 0518) Weight:  [97.9 kg] 97.9 kg (05/29 0453)     Height: 5\' 10"  (177.8 cm) Weight: 97.9 kg BMI (Calculated): 30.97   Physical Exam:  Constitutional: alert, cooperative and no distress  Respiratory: breathing non-labored at rest  Cardiovascular: regular rate and sinus rhythm  Gastrointestinal: soft, mild-tender, and non-distended.  Drain in place  Labs:  CBC Latest Ref Rng & Units 07/01/2019 06/28/2019 06/25/2019  WBC 4.0 - 10.5 K/uL 11.0(H) 11.1(H) 11.0(H)  Hemoglobin 12.0 - 15.0 g/dL 9.7(L) 10.3(L) 11.2(L)  Hematocrit 36.0 - 46.0 % 33.3(L) 30.8(L) 33.3(L)  Platelets 150 - 400 K/uL 391 305 274   CMP Latest Ref Rng & Units 07/01/2019 06/30/2019 06/29/2019  Glucose 70 - 99 mg/dL 121(H) 126(H) 160(H)  BUN 6 - 20 mg/dL 11 12 12   Creatinine 0.44 - 1.00 mg/dL 0.60 0.51 0.53  Sodium 135 - 145 mmol/L 138 137 138  Potassium 3.5 - 5.1 mmol/L 4.2 3.8 3.6  Chloride 98 - 111 mmol/L 101 99 100  CO2 22 - 32 mmol/L 28 29 28   Calcium 8.9 - 10.3 mg/dL 8.3(L) 8.1(L) 8.1(L)  Total Protein 6.5 - 8.1 g/dL 6.5 - -  Total Bilirubin 0.3 - 1.2 mg/dL 0.4 - -  Alkaline Phos 38 - 126 U/L 144(H) - -  AST 15 - 41 U/L 22 - -  ALT 0 - 44 U/L 26 - -    Imaging studies: No new pertinent imaging studies   Assessment/Plan:  49  y.o.femalewith acute appendicitis withabscess s/p CT-guided percutaneous drainage.  Patient slowly improving.  Still with significant cramps on her right lower abdomen.  There is some significant improvement in the physical exam with decreased sensation.  She continue passing gas having bowel movement.  I will order heating pad and muscle relaxant to see if this helps with the muscle cramps.  I will also advance her diet to soft diet to see if she tolerated better.  Will reevaluate in the morning to see if patient can be discharge with adequate pain control and adequate diet toleration.  We will continue with antibiotic therapy and DVT prophylaxis.  I encouraged the patient to ambulate.  [name], MD

## 2019-07-04 MED ORDER — AMOXICILLIN-POT CLAVULANATE 875-125 MG PO TABS: 1.0000 | ORAL_TABLET | Freq: Two times a day (BID) | ORAL | 0 refills | Status: AC

## 2019-07-04 MED ORDER — HYDROCODONE-ACETAMINOPHEN 5-325 MG PO TABS: 1.0000 | ORAL_TABLET | ORAL | 0 refills | Status: DC | PRN

## 2019-07-04 MED ORDER — SODIUM CHLORIDE 0.9% FLUSH: 5.0000 mL | Freq: Every day | INTRAVENOUS | 2 refills | Status: DC

## 2019-07-04 MED ORDER — METHOCARBAMOL 500 MG PO TABS: 500.0000 mg | ORAL_TABLET | Freq: Four times a day (QID) | ORAL | 0 refills | Status: DC | PRN

## 2019-07-04 NOTE — Discharge Summary (Signed)
  Patient ID: Kerri Sullivan MRN: [id number] DOB/AGE: [birth year] 49 y.o.  Admit date: 06/20/2019 Discharge date: 07/04/2019   Discharge Diagnoses:  Active Problems:   Acute appendicitis with appendiceal abscess   Procedures: Percutaneous drainage of intra-abdominal abscess  Hospital Course: Patient with acute appendicitis with phlegmon.  The phlegmon progressed to an abscess that was drained percutaneously.  The patient has been slowly improving.  She had reactive ileus from the abscess that have resolved.  Patient tolerating soft diet.  Patient passing gas and having bowel movement.  There has been no fever.  Patient with intermittent cramps that has been controlled with muscle relaxant and pain medications.  Physical Exam  Constitutional: She is oriented to person, place, and time and well-developed, well-nourished, and in no distress.  Pulmonary/Chest: Effort normal.  Abdominal: Soft. She exhibits no distension. There is no abdominal tenderness. There is no rebound and no guarding.  Neurological: She is alert and oriented to person, place, and time.     Consults: Interventional Radiology  Disposition: Discharge disposition: 01-Home or Self Care       Discharge Instructions    Diet - low sodium heart healthy   Complete by: As directed    Increase activity slowly   Complete by: As directed      Allergies as of 07/04/2019   No Known Allergies     Medication List    TAKE these medications   amoxicillin-clavulanate 875-125 MG tablet Commonly known as: AUGMENTIN Take 1 tablet by mouth every 12 (twelve) hours for 10 days.   fexofenadine 180 MG tablet Commonly known as: ALLEGRA Take 180 mg by mouth daily.   Fish Oil 1200 MG Caps Take 1,200 mg by mouth daily.   HYDROcodone-acetaminophen 5-325 MG tablet Commonly known as: NORCO/VICODIN Take 1 tablet by mouth every 4 (four) hours as needed for moderate pain.   Magnesium 250 MG Tabs Take 250 mg by mouth daily.    methocarbamol 500 MG tablet Commonly known as: ROBAXIN Take 1 tablet (500 mg total) by mouth every 6 (six) hours as needed for muscle spasms.   multivitamin with minerals Tabs tablet Take 1 tablet by mouth daily.   polycarbophil 625 MG tablet Commonly known as: FIBERCON Take 625 mg by mouth daily.   sodium chloride flush 0.9 % Soln Commonly known as: NS Inject 5 mLs into the vein daily.   TURMERIC PO Take 1 tablet by mouth daily.   vitamin B-12 100 MCG tablet Commonly known as: CYANOCOBALAMIN Take 100 mcg by mouth daily.      Follow-up Information    Herbert Pun, MD Follow up in 2 week(s).   Specialty: General Surgery Contact information: [address] [address] [phone]          This discharge encounter was more than 30 minutes most of the time counseling the patient and coordinating plan of care.

## 2019-07-04 NOTE — Discharge Instructions (Signed)
Appendicitis, Adult  The appendix is a tube in the body that is shaped like a finger. It is attached to the large intestine. Appendicitis means that this tube is swollen (inflamed). If this is not treated, the tube can tear (rupture). This can lead to a life-threatening infection. This condition can also cause pus to build up in the appendix (abscess). What are the causes? This condition may be caused by something that blocks the appendix. These include:  A ball of poop (stool).  Lymph glands that are bigger than normal. Sometimes the cause is not known. What increases the risk? You are more likely to develop this condition if you are between 9 and 80 years of age. What are the signs or symptoms? Symptoms of this condition include:  Pain around the belly button (navel). ? The pain moves toward the lower right belly (abdomen). ? The pain can get worse with time. ? The pain can get worse if you cough. ? The pain can get worse if you move suddenly.  Tenderness in the lower right belly.  Feeling sick to your stomach (nauseous).  Throwing up (vomiting).  Not feeling hungry (loss of appetite).  A fever.  Having trouble pooping (constipation).  Watery poop (diarrhea).  Not feeling well. How is this treated? Most often, this condition is treated by taking out the appendix (appendectomy). There are two ways to do this:  Open surgery. For this method, the appendix is taken out through a large cut (incision). The cut is made in the lower right belly. This surgery may be used if: ? You have scars from another surgery. ? You have a bleeding condition. ? You are pregnant and will be having your baby soon. ? You have a condition that makes it hard to do the other type of surgery.  Laparoscopic surgery. For this method, the appendix is taken out through small cuts. Often, this surgery: ? Causes less pain. ? Causes fewer problems. ? Is easier to heal from. If your appendix tears  and pus forms:  A drain may be put into the sore. The drain will be used to get rid of the pus.  You may get an antibiotic medicine through an IV line.  Your appendix may or may not need to be taken out. Follow these instructions at home: If you had surgery, follow instructions from your doctor on how to care for yourself at home and how to take care of your cut from surgery. Medicines  Take over-the-counter and prescription medicines only as told by your doctor.  If you were prescribed an antibiotic medicine, take it as told by your doctor. Do not stop taking the antibiotic even if you start to feel better. Eating and drinking Follow instructions from your doctor about what you cannot eat or drink. You may go back to your diet slowly if:  You no longer feel sick to your stomach.  You have stopped throwing up. General instructions  Do not use any products that contain nicotine or tobacco, such as cigarettes, e-cigarettes, and chewing tobacco. If you need help quitting, ask your doctor.  Do not drive or use heavy machinery while taking prescription pain medicine.  Ask your doctor if the medicine you are taking can cause trouble pooping. You may need to take steps to prevent or treat trouble pooping: ? Drink enough fluid to keep your pee (urine) pale yellow. ? Take over-the-counter or prescription medicines. ? Eat foods that are high in fiber. These include  beans, whole grains, and fresh fruits and vegetables. ? Limit foods that are high in fat and sugar. These include fried or sweet foods.  Keep all follow-up visits as told by your doctor. This is important. Contact a doctor if:  There is pus, blood, or a lot of fluid coming from your cut or cuts from surgery.  You are sick to your stomach or you throw up. Get help right away if:  You have pain in your belly, and the pain is getting worse.  You have a fever.  You have chills.  You are very tired.  You have muscle  pain.  You are short of breath. Summary  Appendicitis is swelling of the appendix. The appendix is a tube that is shaped like a finger. It is joined to the large intestine.  This condition may be caused by something that blocks the appendix. This can lead to an infection.  This condition is usually treated by taking out the appendix. This information is not intended to replace advice given to you by your health care provider. Make sure you discuss any questions you have with your health care provider. Document Revised: 07/09/2017 Document Reviewed: 07/09/2017 [employer] Patient Education  [place].  Diet: Resume home heart healthy regular diet.   Activity: Light activity and walking are encouraged. Do not drive or drink alcohol if taking narcotic pain medications.  Wound care: May shower with soapy water and pat dry (do not rub incisions), but no baths or submerging incision underwater until follow-up. (no swimming)   Medications: Resume all home medications. For mild to moderate pain: acetaminophen (Tylenol) or ibuprofen (if no kidney disease). Combining Tylenol with alcohol can substantially increase your risk of causing liver disease. Narcotic pain medications, if prescribed, can be used for severe pain, though may cause nausea, constipation, and drowsiness. Do not combine Tylenol and Norco within a 6 hour period as Norco contains Tylenol. If you do not need the narcotic pain medication, you do not need to fill the prescription.  Call office [phone]) at any time if any questions, worsening pain, fevers/chills, bleeding, drainage from incision site, or other concerns.

## 2019-07-04 NOTE — Plan of Care (Signed)
Patient states ready for discharge. PICC removed by IV team. JP drain education given and able to demonstrate accurately to flush and care for drain.  Discharge and medication instructions reviewed with patient who verbalized understanding.  Hard script given as well.  Dressing change and flushes given for discharge.  Patient assisted via w/c to entrance for transport home by family.

## 2019-07-07 ENCOUNTER — Other Ambulatory Visit: Payer: Self-pay | Admitting: Interventional Radiology

## 2019-07-08 ENCOUNTER — Other Ambulatory Visit: Payer: Self-pay | Admitting: General Surgery

## 2019-07-08 DIAGNOSIS — K3533 Acute appendicitis with perforation and localized peritonitis, with abscess: Secondary | ICD-10-CM

## 2019-07-14 ENCOUNTER — Ambulatory Visit
Admission: RE | Admit: 2019-07-14 | Discharge: 2019-07-14 | Disposition: A | Discharge status: Home / Self Care | Payer: BC Managed Care – PPO | Source: Ambulatory Visit | Attending: General Surgery | Admitting: General Surgery

## 2019-07-14 ENCOUNTER — Encounter: Payer: Self-pay | Admitting: Radiology

## 2019-07-14 DIAGNOSIS — K3533 Acute appendicitis with perforation and localized peritonitis, with abscess: Secondary | ICD-10-CM

## 2019-07-14 HISTORY — PX: IR RADIOLOGIST EVAL & MGMT: IMG5224

## 2019-07-14 MED ORDER — IOPAMIDOL (ISOVUE-300) INJECTION 61%
100.0000 mL | Freq: Once | INTRAVENOUS | Status: AC | PRN
  Administered 2019-07-14: 100 mL via INTRAVENOUS

## 2019-07-14 NOTE — Progress Notes (Signed)
Chief Complaint: Patient was seen in consultation today for appendiceal abscess drainage follow up at the request of Cintron-Diaz,Edgardo  Referring Physician(s): Cintron-Diaz,Edgardo  History of Present Illness: Kerri Sullivan is a 49 y.o. female with ruptured appendicitis and appendiceal abscess status post recent hospitalization during which she underwent percutaneous catheter drainage of an appendiceal abscess on 06/26/2019 by Dr. Anselm Pancoast.  She was discharged from the hospital on 07/04/2019 and is now just completing oral antibiotics.  She has not had any fever at home since discharge and has not had any significant drainage via the abscess drain since discharge from the hospital.  She denies any abdominal pain, nausea or vomiting.  She has resumed a regular diet.  She is scheduled to follow-up with Dr. Windell Moment on Friday.  Past Medical History:  Diagnosis Date  . Arthritis    rheumatoid arthritis-OFF METHOTREXATE SINCE 2016 AND DOING WELL PER PT (09-13-15)  . Asthma   . Family history of adverse reaction to anesthesia    MOM-N/V  . History of abnormal cervical Pap smear     Past Surgical History:  Procedure Laterality Date  . CHOLECYSTECTOMY  1998  . FOOT SURGERY Left    repair of tendon  . [place]   right x2 and left x 1-arthroscopy  . LIPOMA EXCISION N/A 09/21/2015   Procedure: EXCISION LIPOMA BACK OF NECK;  Surgeon: Christene Lye, MD;  Location: [facility] ORS;  Service: General;  Laterality: N/A;    Allergies: Patient has no known allergies.  Medications: Prior to Admission medications   Medication Sig Start Date End Date Taking? Authorizing Provider  amoxicillin-clavulanate (AUGMENTIN) 875-125 MG tablet Take 1 tablet by mouth every 12 (twelve) hours for 10 days. 07/04/19 07/14/19  Herbert Pun, MD  fexofenadine (ALLEGRA) 180 MG tablet Take 180 mg by mouth daily.    [provider]  HYDROcodone-acetaminophen (NORCO/VICODIN) 5-325 MG tablet  Take 1 tablet by mouth every 4 (four) hours as needed for moderate pain. 07/04/19   Herbert Pun, MD  Magnesium 250 MG TABS Take 250 mg by mouth daily.    [provider]  methocarbamol (ROBAXIN) 500 MG tablet Take 1 tablet (500 mg total) by mouth every 6 (six) hours as needed for muscle spasms. 07/04/19   Herbert Pun, MD  Multiple Vitamin (MULTIVITAMIN WITH MINERALS) TABS tablet Take 1 tablet by mouth daily.    [provider]  Omega-3 Fatty Acids (FISH OIL) 1200 MG CAPS Take 1,200 mg by mouth daily.    [provider]  polycarbophil (FIBERCON) 625 MG tablet Take 625 mg by mouth daily.    [provider]  sodium chloride flush (NS) 0.9 % SOLN Inject 5 mLs into the vein daily. 07/04/19   Herbert Pun, MD  TURMERIC PO Take 1 tablet by mouth daily.    [provider]  vitamin B-12 (CYANOCOBALAMIN) 100 MCG tablet Take 100 mcg by mouth daily.    [provider]     Family History  Problem Relation Age of Onset  . Cancer Paternal Aunt 93       breast  . Cancer Cousin        colon  . Osteoporosis Mother   . Heart disease Father   . Hypertension Father   . Diabetes Brother   . Alcoholism Brother   . Suicidality Brother   . Lung cancer Cousin        died late 26s    Social History   Socioeconomic  History  . Marital status: Divorced    Spouse name: Not on file  . Number of children: 1  . Years of education: 44  . Highest education level: Not on file  Occupational History  . Occupation: Pharmacist, hospital  Tobacco Use  . Smoking status: Never Smoker  . Smokeless tobacco: Never Used  Substance and Sexual Activity  . Alcohol use: Yes    Comment: RARE  . Drug use: No  . Sexual activity: Not Currently    Birth control/protection: None  Other Topics Concern  . Not on file  Social History Narrative  . Not on file   Social Determinants of Health   Financial Resource Strain:   . Difficulty of Paying Living  Expenses:   Food Insecurity:   . Worried About Charity fundraiser in the Last Year:   . Arboriculturist in the Last Year:   Transportation Needs:   . Film/video editor (Medical):   Marland Kitchen Lack of Transportation (Non-Medical):   Physical Activity:   . Days of Exercise per Week:   . Minutes of Exercise per Session:   Stress:   . Feeling of Stress :   Social Connections:   . Frequency of Communication with Friends and Family:   . Frequency of Social Gatherings with Friends and Family:   . Attends Religious Services:   . Active Member of Clubs or Organizations:   . Attends Archivist Meetings:   Marland Kitchen Marital Status:     Review of Systems: A 12 point ROS discussed and pertinent positives are indicated in the HPI above.  All other systems are negative.  Review of Systems  Constitutional: Negative.   Respiratory: Negative.   Cardiovascular: Negative.   Gastrointestinal: Negative.   Genitourinary: Negative.   Musculoskeletal: Negative.     Vital Signs: LMP 06/18/2019 (Exact Date) Comment: neg preg test  Physical Exam Vitals reviewed.  Constitutional:      General: She is not in acute distress.    Appearance: She is not ill-appearing, toxic-appearing or diaphoretic.  Abdominal:     General: There is no distension.     Palpations: Abdomen is soft.     Tenderness: There is no abdominal tenderness. There is no guarding or rebound.     Comments: RLQ drain site clean and dry without erythema, drainage or tenderness. Drain is intact.  Skin:    General: Skin is warm and dry.  Neurological:     General: No focal deficit present.     Mental Status: She is alert and oriented to person, place, and time.     Imaging: CT ABDOMEN PELVIS W CONTRAST  Result Date: 07/14/2019 CLINICAL DATA:  Ruptured appendicitis and appendiceal abscess. Status post percutaneous catheter drainage of appendiceal abscess on 06/26/2019. EXAM: CT ABDOMEN AND PELVIS WITH CONTRAST TECHNIQUE:  Multidetector CT imaging of the abdomen and pelvis was performed using the standard protocol following bolus administration of intravenous contrast. CONTRAST:  140mL ISOVUE-300 IOPAMIDOL (ISOVUE-300) INJECTION 61% COMPARISON:  06/26/1998 FINDINGS: Lower chest: No acute abnormality. Hepatobiliary: No focal liver abnormality is seen. Status post cholecystectomy. No biliary dilatation. Pancreas: Unremarkable. No pancreatic ductal dilatation or surrounding inflammatory changes. Spleen: Normal in size without focal abnormality. Adrenals/Urinary Tract: Adrenal glands are unremarkable. Kidneys are normal, without renal calculi, focal lesion, or hydronephrosis. Bladder is unremarkable. Stomach/Bowel: Complete resolution of small bowel ileus. No bowel obstruction, ileus or free intraperitoneal air. Appendiceal inflammation demonstrates significant improvement. Proximal appendix demonstrates mild residual thickening and measures  approximately 10 mm in diameter. The mid to distal appendix is normal in caliber. Slightly hyperdense inspissated material in the proximal appendix without overtly calcific appendicoliths. Vascular/Lymphatic: No significant vascular findings are present. No enlarged abdominal or pelvic lymph nodes. Reproductive: Uterus and bilateral adnexa are unremarkable. Other: Percutaneous drain remains present medial to the cecum. Focal abscess has completely resolved. Free fluid in the pelvis markedly reduced since the prior scan with some small amount of free fluid remaining posterior to the uterus in the cul-de-sac in an area measuring roughly 1.8 x 4.5 cm. Musculoskeletal: No acute or significant osseous findings. IMPRESSION: 1. Significant improvement in inflammation of the appendix. There is mild residual thickening of the proximal appendix measuring 10 mm in diameter. The mid to distal appendix is normal in caliber. 2. Percutaneous drain remains medial to the cecum. Focal appendiceal abscess has completely  resolved. Free fluid in the pelvis markedly reduced since the prior scan with some small amount of free fluid remaining posterior to the uterus in the cul-de-sac in an area measuring roughly 1.8 x 4.5 cm. 3. Complete resolution of small bowel ileus. 4. Abscess drain injection was performed following the CT to evaluate for fistula and will be dictated separately. Electronically Signed   By: Aletta Edouard M.D.   On: 07/14/2019 10:52   CT ABDOMEN PELVIS W CONTRAST  Result Date: 06/26/2019 CLINICAL DATA:  Follow-up appendicitis EXAM: CT ABDOMEN AND PELVIS WITH CONTRAST TECHNIQUE: Multidetector CT imaging of the abdomen and pelvis was performed using the standard protocol following bolus administration of intravenous contrast. CONTRAST:  172mL OMNIPAQUE IOHEXOL 300 MG/ML  SOLN COMPARISON:  06/24/2019, 06/20/2019 FINDINGS: Lower chest: Small bilateral pleural effusions, unchanged compared to prior examination. Hepatobiliary: No focal liver abnormality is seen. Status post cholecystectomy. No biliary dilatation. Pancreas: Unremarkable. No pancreatic ductal dilatation or surrounding inflammatory changes. Spleen: Normal in size without significant abnormality. Adrenals/Urinary Tract: Adrenal glands are unremarkable. Kidneys are normal, without renal calculi, solid lesion, or hydronephrosis. Bladder is unremarkable. Stomach/Bowel: Stomach is within normal limits. The proximal to mid small bowel is diffusely fluid-filled and distended, measuring up to 4.6 cm in caliber. There is no obvious transition point although there is a gradual decrease in caliber of the small bowel in the vicinity of multiple thickened loops in the anterior right lower quadrant. There is gas and stool present in the colon to the rectum. Unchanged thickened appendix in the right lower quadrant measuring up to 1.5 cm in caliber with small appendicolith near the base. Unchanged small pocket of ascites or fluid collection adjacent to the cecal base,  measuring approximately 4.6 x 3.4 cm although distinct from the appendix (series 2, image 74). Vascular/Lymphatic: Aortic atherosclerosis. No enlarged abdominal or pelvic lymph nodes. Reproductive: No mass or other significant abnormality. Other: No abdominal wall hernia or abnormality. Small volume pelvic ascites, unchanged compared to prior examination. Musculoskeletal: No acute or significant osseous findings. IMPRESSION: 1. Unchanged thickened appendix in the right lower quadrant measuring up to 1.5 cm in caliber with small appendicolith near the base, consistent with acute appendicitis. 2. Unchanged small pocket of ascites or fluid collection adjacent to the cecal base, measuring approximately 4.6 x 3.4 cm although distinct from the appendix. 3. The proximal to mid small bowel is diffusely fluid-filled and distended, measuring up to 4.6 cm in caliber. There is no obvious transition point although there is a gradual decrease in caliber of the small bowel in the vicinity of multiple thickened loops in the anterior right lower  quadrant. There is gas and stool present in the colon to the rectum. Findings are most consistent with ileus. 4. Small volume pleural effusions and ascites. 5. Aortic Atherosclerosis (ICD10-I70.0). Electronically Signed   By: Eddie Candle M.D.   On: 06/26/2019 13:37   CT ABDOMEN PELVIS W CONTRAST  Result Date: 06/24/2019 CLINICAL DATA:  Acute appendicitis with phlegmon diagnosed 4 days ago, follow-up EXAM: CT ABDOMEN AND PELVIS WITH CONTRAST TECHNIQUE: Multidetector CT imaging of the abdomen and pelvis was performed using the standard protocol following bolus administration of intravenous contrast. Sagittal and coronal MPR images reconstructed from axial data set. CONTRAST:  183mL OMNIPAQUE IOHEXOL 300 MG/ML SOLN IV. Dilute oral contrast. COMPARISON:  06/20/2019 FINDINGS: Lower chest: Bibasilar atelectasis and minimal pleural effusions. Hepatobiliary: Gallbladder surgically absent. Liver  normal appearance Pancreas: Normal appearance Spleen: Normal appearance Adrenals/Urinary Tract: Adrenal glands, kidneys, ureters, and bladder normal appearance Stomach/Bowel: Again identified enlarged thickened appendix with periappendiceal inflammatory changes and small appendicolith consistent with acute appendicitis. Dilated small bowel loops throughout upper and mid abdomen. Thickened cecum and distal small bowel loops, which appear decompressed, favor ileus proximally though obstruction not entirely excluded. Colon decompressed. Stomach unremarkable. Vascular/Lymphatic: Vascular structures patent. Mild scattered atherosclerotic calcification aorta without aneurysm. No adenopathy. Reproductive: Normal appearing uterus and ovaries Other: Free fluid in pelvis and mildly scattered interloop, with minimal peritoneal enhancement in the pelvis which could represent peritonitis. Small focal fluid collection medial to the cecum, rounded, could represent developing abscess 4.0 x 2.8 x 2.7 cm. No additional focal fluid collections identified. Small amount of fluid at the pericolic gutters and perihepatic. Musculoskeletal: Osseous structures unremarkable IMPRESSION: Acute appendicitis with small appendicoliths. Small focal fluid collection medial to cecum question developing abscess 4.0 x 2.8 x 2.7 cm. Scattered ascites greatest in pelvis with minimal peritoneal enhancement in the pelvis which could reflect peritonitis. Dilated small bowel loops throughout upper and mid abdomen with decompressed and mildly thickened bowel distal small bowel loops, favor ileus. Bibasilar atelectasis and minimal pleural effusions. Aortic Atherosclerosis (ICD10-I70.0). These results will be called to the ordering clinician or representative by the Radiologist Assistant, and communication documented in the PACS or [employer]. Electronically Signed   By: Lavonia Dana M.D.   On: 06/24/2019 11:44   CT ABDOMEN PELVIS W CONTRAST  Result  Date: 06/20/2019 CLINICAL DATA:  Stabbing pain in stomach EXAM: CT ABDOMEN AND PELVIS WITH CONTRAST TECHNIQUE: Multidetector CT imaging of the abdomen and pelvis was performed using the standard protocol following bolus administration of intravenous contrast. CONTRAST:  171mL OMNIPAQUE IOHEXOL 300 MG/ML  SOLN COMPARISON:  None. FINDINGS: Lower chest: Lung bases are clear. Normal heart size. No pericardial effusion. Hepatobiliary: No focal liver abnormality is seen. Patient is post cholecystectomy. Slight prominence of the biliary tree likely related to reservoir effect. No calcified intraductal gallstones. Pancreas: Unremarkable. No pancreatic ductal dilatation or surrounding inflammatory changes. Spleen: Normal in size without focal abnormality. Adrenals/Urinary Tract: Normal adrenals. Kidneys are normally located with symmetric enhancement and excretion. No suspicious renal lesion, urolithiasis or hydronephrosis. Urinary bladder is largely decompressed at the time of exam and therefore poorly evaluated by CT imaging. No visible bladder abnormality. Stomach/Bowel: There is a fluid-filled, thickened and hyperemic appendix containing at least 3 small fecalith measuring up to 7 mm in size extensive periappendiceal phlegmon and fat stranding as well as reactive thickening of the cecum and terminal ileum. No extraluminal gas. Trace fluid in the pericolic gutters favored to be reactive. Distal esophagus, stomach and duodenal sweep are  unremarkable. No proximal small bowel wall thickening or dilatation. No other colonic dilatation or wall thickening. No evidence of obstruction. Vascular/Lymphatic: Atherosclerotic calcifications throughout the abdominal aorta and branch vessels. No aneurysm or ectasia. No enlarged abdominopelvic lymph nodes. Few reactive nodes in the right lower quadrant. Reproductive: Normal appearance of the uterus and adnexal structures. Radiolucent tampon within the vaginal canal. Other: Phlegmon and  trace reactive fluid in the right pericolic gutter adjacent the appendix, as above. No free air. No bowel containing hernias Musculoskeletal: Mild straightening of the normal spinal curvature. Discogenic change L5-S1 with small global disc bulge and facet arthropathy. No acute osseous abnormality or suspicious osseous lesion. IMPRESSION: Acute, uncomplicated appendicitis with extensive periappendiceal phlegmon and fat stranding as well as reactive thickening of the cecum and terminal ileum. No evidence of perforation or visible abscess formation. Electronically Signed   By: Lovena Le M.D.   On: 06/20/2019 14:54   CT IMAGE GUIDED DRAINAGE PERCUT CATH  PERITONEAL RETROPERIT  Result Date: 06/27/2019 INDICATION: 49 year old with acute appendicitis. Concern for abscess in the right lower abdomen. EXAM: CT-GUIDED PLACEMENT OF ABDOMINAL ABSCESS DRAIN MEDICATIONS: Moderate sedation ANESTHESIA/SEDATION: Fentanyl 100 mcg IV; Versed 2.0 mg IV Moderate Sedation Time:  20 minutes The patient was continuously monitored during the procedure by the interventional radiology nurse under my direct supervision. COMPLICATIONS: None immediate. PROCEDURE: Informed written consent was obtained from the patient after a thorough discussion of the procedural risks, benefits and alternatives. All questions were addressed. A timeout was performed prior to the initiation of the procedure. Patient was placed supine on the CT scanner. Images through the lower abdomen were obtained. The fluid collection in the right lower abdomen was identified and targeted. The overlying skin was prepped with chlorhexidine and sterile field was created. Skin was anesthetized with 1% lidocaine. Using CT guidance, an 18 gauge trocar needle was directed into the fluid collection. Tan purulent fluid was aspirated. Superstiff Amplatz wire was advanced into the collection and the tract was dilated to accommodate a 10 [place] multipurpose drain. Approximately 40 mL  of purulent fluid was removed. Catheter was sutured to skin and attached to a suction bulb. Follow up CT images were obtained. Dressing was placed over the drain. Fluid sample was sent for culture. FINDINGS: Small fluid collection in the anterior right lower abdomen. Tan colored purulent fluid was aspirated from the collection. 10 French drain was placed and the collection was decompressed. Approximately 40 mL of purulent fluid was removed. IMPRESSION: Successful CT-guided placement of a drainage catheter within the right lower abdominal abscess. Electronically Signed   By: Markus Daft M.D.   On: 06/27/2019 13:32   [place] EKG SITE RITE  Result Date: 06/24/2019 If Site Rite image not attached, placement could not be confirmed due to current cardiac rhythm.  IR Radiologist Eval & Mgmt  Result Date: 07/14/2019 Please refer to notes tab for details about interventional procedure. (Op Note)   Labs:  CBC: Recent Labs    06/23/19 0803 06/25/19 0435 06/28/19 0644 07/01/19 1416  WBC 13.2* 11.0* 11.1* 11.0*  HGB 12.7 11.2* 10.3* 9.7*  HCT 38.2 33.3* 30.8* 33.3*  PLT 259 274 305 391    COAGS: No results for input(s): INR, APTT in the last 8760 hours.  BMP: Recent Labs    06/28/19 0644 06/29/19 0506 06/30/19 0544 07/01/19 0519  NA 139 138 137 138  K 3.2* 3.6 3.8 4.2  CL 102 100 99 101  CO2 27 28 29 28   GLUCOSE 166* 160*  126* 121*  BUN 14 12 12 11   CALCIUM 7.9* 8.1* 8.1* 8.3*  CREATININE 0.50 0.53 0.51 0.60  GFRNONAA >60 >60 >60 >60  GFRAA >60 >60 >60 >60    LIVER FUNCTION TESTS: Recent Labs    06/20/19 1329 06/20/19 1329 06/25/19 0435 06/26/19 0440 06/28/19 0644 07/01/19 0519  BILITOT 0.8  --  1.6*  --  0.5 0.4  AST 22  --  16  --  22 22  ALT 28  --  23  --  23 26  ALKPHOS 67  --  107  --  84 144*  PROT 7.5  --  6.0*  --  5.7* 6.5  ALBUMIN 4.1   < > 2.4* 2.4* 2.0* 2.1*   < > = values in this interval not displayed.    Assessment and Plan:  CT today demonstrates  complete resolution of the appendiceal abscess after catheter drainage.  Appendiceal inflammation is improved with some residual mild thickening of the proximal appendix.  Small bowel ileus has resolved.  There is a small amount of residual free fluid in the cul-de-sac.  Fluoroscopic drain injection was performed today demonstrating no significant abscess cavity with contrast refluxing along the drain and out of the skin.  There is no evidence of fistula to the appendix or bowel.  Given CT and drain injection findings, the percutaneous drainage catheter was removed today in its entirety without difficulty.  A dressing was applied to the drain exit site and the patient given wound care instructions.  She will follow-up with Dr. Windell Moment on Friday for surgical follow-up.   Electronically Signed: Azzie Roup 07/14/2019, 10:54 AM     I spent a total of 10 Minutes in face to face in clinical consultation, greater than 50% of which was counseling/coordinating care for appendiceal abscess drain management.

## 2019-07-16 ENCOUNTER — Ambulatory Visit: Payer: Self-pay | Admitting: General Surgery

## 2019-07-16 NOTE — H&P (View-Only) (Signed)
PATIENT PROFILE: Kerri MAHLER is a 49 y.o. female who presents to the Clinic for evaluation of history of appendicitis with abscess.  PCP:  Lenard Simmer, MD  HISTORY OF PRESENT ILLNESS: Kerri Sullivan reports patient with a history of acute appendicitis with phlegmon.  She was initially treated with IV antibiotic therapy.  She developed a reactive ileus.  Eventually she organized abscess that was able to be drained percutaneously.  She was treated with IV antibiotic therapy.  She started to improve clinically.  She was able to tolerate diet.  She had a follow-up CT scan and sinus drain study.  This shows that the abscess completely resolved and there was no fistulous tract to the appendix or large intestine.  I personally evaluated the images of the CT scan and fluoroscopy study.  Patient today reports significant improvement of her symptoms.  There has been no more cramps.  She has been able to tolerate regular diet and having regular bowel movement.  There is no pain radiation.  There is no alleviating or aggravating factors.   PROBLEM LIST:        Problem List  Date Reviewed: 12/09/2017       Noted   Obesity 05/24/2013   Inflammatory arthritis(SERONEGATIVE) ([place]) 05/24/2013   Overview    A.METHOTREXATE      Pain syndrome, chronic 05/24/2013   Diabetes (CMS-HCC) 05/24/2013      GENERAL REVIEW OF SYSTEMS:   General ROS: negative for - chills, fatigue, fever, weight gain or weight loss Allergy and Immunology ROS: negative for - hives  Hematological and Lymphatic ROS: negative for - bleeding problems or bruising, negative for palpable nodes Endocrine ROS: negative for - heat or cold intolerance, hair changes Respiratory ROS: negative for - cough, shortness of breath or wheezing Cardiovascular ROS: no chest pain or palpitations GI ROS: negative for nausea, vomiting, diarrhea, constipation.  Positive for abdominal pain Musculoskeletal ROS: negative for - joint swelling or  muscle pain Neurological ROS: negative for - confusion, syncope Dermatological ROS: negative for pruritus and rash Psychiatric: negative for anxiety, depression, difficulty sleeping and memory loss  MEDICATIONS: Current Medications        Current Outpatient Medications  Medication Sig Dispense Refill  . acetaminophen (TYLENOL) 500 mg capsule Take 1,000 mg by mouth every 6 (six) hours as needed for Fever. PRN FOR PAIN    . calcium polycarbophiL (FIBERCON) 625 mg tablet Take 1 tablet by mouth once daily    . cyanocobalamin (VITAMIN B12) 100 MCG tablet Take 1 tablet by mouth once daily    . fexofenadine (ALLEGRA) 180 MG tablet Take 1 tablet by mouth once daily    . ibuprofen (ADVIL,MOTRIN) 200 MG tablet Take 200 mg by mouth every 6 (six) hours as needed for Pain    . magnesium 250 mg Tab Take by mouth once daily    . multivitamin with minerals tablet Take 1 tablet by mouth once daily    . omega-3 fatty acids-fish oil 360-1,200 mg Cap Take 1 capsule by mouth once daily    . TURMERIC ORAL Take 1 tablet by mouth once daily     No current facility-administered medications for this visit.      ALLERGIES: Patient has no known allergies.  PAST MEDICAL HISTORY:     Past Medical History:  Diagnosis Date  . Obesity   . Rheumatoid arthritis (CMS-HCC)     PAST SURGICAL HISTORY:      Past Surgical History:  Procedure Laterality Date  .  CHOLECYSTECTOMY  1998  . Foot surgery    . KNEE ARTHROSCOPY Bilateral 1988     FAMILY HISTORY:      Family History  Problem Relation Age of Onset  . Hypotension Mother   . Heart disease Father   . High blood pressure (Hypertension) Father   . Myocardial Infarction (Heart attack) Paternal Grandmother   . Myocardial Infarction (Heart attack) Paternal Grandfather   . Stroke Paternal Grandfather      SOCIAL HISTORY: Social History          Socioeconomic History  . Marital status: Single    Spouse name:  Not on file  . Number of children: Not on file  . Years of education: Not on file  . Highest education level: Not on file  Occupational History  . Not on file  Tobacco Use  . Smoking status: Former Research scientist (life sciences)  . Smokeless tobacco: Never Used  Vaping Use  . Vaping Use: Never used  Substance and Sexual Activity  . Alcohol use: No    Alcohol/week: 0.0 standard drinks  . Drug use: Not on file  . Sexual activity: Not on file  Other Topics Concern  . Not on file  Social History Narrative  . Not on file   Social Determinants of Health      Financial Resource Strain:   . Difficulty of Paying Living Expenses:   Food Insecurity:   . Worried About Charity fundraiser in the Last Year:   . Arboriculturist in the Last Year:   Transportation Needs:   . Film/video editor (Medical):   Marland Kitchen Lack of Transportation (Non-Medical):       PHYSICAL EXAM:    Vitals:   07/16/19 0814  BP: (!) 133/91  Pulse: 106   Body mass index is 29.69 kg/m. Weight: 92.5 kg (204 lb)   GENERAL: Alert, active, oriented x3  HEENT: Pupils equal reactive to light. Extraocular movements are intact. Sclera clear. Palpebral conjunctiva normal red color.Pharynx clear.  NECK: Supple with no palpable mass and no adenopathy.  LUNGS: Sound clear with no rales rhonchi or wheezes.  HEART: Regular rhythm S1 and S2 without murmur.  ABDOMEN: Soft and depressible, nontender with no palpable mass, no hepatomegaly.   EXTREMITIES: Well-developed well-nourished symmetrical with no dependent edema.  NEUROLOGICAL: Awake alert oriented, facial expression symmetrical, moving all extremities.  REVIEW OF DATA: I have reviewed the following data today:      No visits with results within 3 Month(s) from this visit.  Latest known visit with results is:  Office Visit on 07/02/2018  Component Date Value  . Color 07/02/2018 Yellow   . Clarity 07/02/2018 Clear   . Specific Gravity 07/02/2018 1.010   . pH,  Urine 07/02/2018 6.5   . Protein, Urinalysis 07/02/2018 Negative   . Glucose, Urinalysis 07/02/2018 Negative   . Ketones, Urinalysis 07/02/2018 Negative   . Blood, Urinalysis 07/02/2018 Negative   . Nitrite, Urinalysis 07/02/2018 Negative   . Leukocyte Esterase, Urin* 07/02/2018 Negative      ASSESSMENT: Kerri Sullivan is a 49 y.o. female presenting for evaluation of history of appendicitis with abscess.  Patient with previous history of appendicitis with abscess treated with IV antibiotics and percutaneous drainage.  Her course was significantly complicated due to initial reactive ileus and an medial abscess that was difficult to drain percutaneously.  Eventually was able to be drained and patient improved clinically.  Follow-up study shows resolution of the abscess and no fistulous tract.  Drain was removed 2 days ago.  The patient reported that she has been doing well.  The pain is minimal.  Patient is tolerating regular diet.  We discussed about the alternative of having elective interval appendectomy.  I discussed with her the technical aspect of the surgery that includes robotic assisted laparoscopic appendectomy.  I also discussed with the patient the possibility of needing to remove more of her colon if the area of the seeking does not look healthy enough to just remove the appendix.  I discussed with the pain and the possible risks of surgery that includes bleeding, infection, leak from the appendectomy stump or anastomosis, intra-abdominal abscess, ileus, bowel perforation, injury to adjacent organs, among others.  Patient reports he understood and agreed to proceed.  Appendicitis with abscess [K35.33]  PLAN: 1. Robotic assisted laparoscopic appendectomy (57262) 2. Avoid taking aspirin 5 days before the surgery 3. Bowel prep the day before the surgery 4. Contact us if has any question or concern.   Patient verbalized understanding, all questions were answered, and were agreeable  with the plan outlined above.     Herbert Pun, MD  Electronically signed by Herbert Pun, MD

## 2019-07-16 NOTE — H&P (Signed)
PATIENT PROFILE: Kerri Sullivan is a 49 y.o. female who presents to the Clinic for evaluation of history of appendicitis with abscess.  PCP:  Lenard Simmer, MD  HISTORY OF PRESENT ILLNESS: Ms. Mower reports patient with a history of acute appendicitis with phlegmon.  She was initially treated with IV antibiotic therapy.  She developed a reactive ileus.  Eventually she organized abscess that was able to be drained percutaneously.  She was treated with IV antibiotic therapy.  She started to improve clinically.  She was able to tolerate diet.  She had a follow-up CT scan and sinus drain study.  This shows that the abscess completely resolved and there was no fistulous tract to the appendix or large intestine.  I personally evaluated the images of the CT scan and fluoroscopy study.  Patient today reports significant improvement of her symptoms.  There has been no more cramps.  She has been able to tolerate regular diet and having regular bowel movement.  There is no pain radiation.  There is no alleviating or aggravating factors.   PROBLEM LIST:        Problem List  Date Reviewed: 12/09/2017       Noted   Obesity 05/24/2013   Inflammatory arthritis(SERONEGATIVE) ([place]) 05/24/2013   Overview    A.METHOTREXATE      Pain syndrome, chronic 05/24/2013   Diabetes (CMS-HCC) 05/24/2013      GENERAL REVIEW OF SYSTEMS:   General ROS: negative for - chills, fatigue, fever, weight gain or weight loss Allergy and Immunology ROS: negative for - hives  Hematological and Lymphatic ROS: negative for - bleeding problems or bruising, negative for palpable nodes Endocrine ROS: negative for - heat or cold intolerance, hair changes Respiratory ROS: negative for - cough, shortness of breath or wheezing Cardiovascular ROS: no chest pain or palpitations GI ROS: negative for nausea, vomiting, diarrhea, constipation.  Positive for abdominal pain Musculoskeletal ROS: negative for - joint swelling or  muscle pain Neurological ROS: negative for - confusion, syncope Dermatological ROS: negative for pruritus and rash Psychiatric: negative for anxiety, depression, difficulty sleeping and memory loss  MEDICATIONS: Current Medications        Current Outpatient Medications  Medication Sig Dispense Refill  . acetaminophen (TYLENOL) 500 mg capsule Take 1,000 mg by mouth every 6 (six) hours as needed for Fever. PRN FOR PAIN    . calcium polycarbophiL (FIBERCON) 625 mg tablet Take 1 tablet by mouth once daily    . cyanocobalamin (VITAMIN B12) 100 MCG tablet Take 1 tablet by mouth once daily    . fexofenadine (ALLEGRA) 180 MG tablet Take 1 tablet by mouth once daily    . ibuprofen (ADVIL,MOTRIN) 200 MG tablet Take 200 mg by mouth every 6 (six) hours as needed for Pain    . magnesium 250 mg Tab Take by mouth once daily    . multivitamin with minerals tablet Take 1 tablet by mouth once daily    . omega-3 fatty acids-fish oil 360-1,200 mg Cap Take 1 capsule by mouth once daily    . TURMERIC ORAL Take 1 tablet by mouth once daily     No current facility-administered medications for this visit.      ALLERGIES: Patient has no known allergies.  PAST MEDICAL HISTORY:     Past Medical History:  Diagnosis Date  . Obesity   . Rheumatoid arthritis (CMS-HCC)     PAST SURGICAL HISTORY:      Past Surgical History:  Procedure Laterality Date  .  CHOLECYSTECTOMY  1998  . Foot surgery    . KNEE ARTHROSCOPY Bilateral 1988     FAMILY HISTORY:      Family History  Problem Relation Age of Onset  . Hypotension Mother   . Heart disease Father   . High blood pressure (Hypertension) Father   . Myocardial Infarction (Heart attack) Paternal Grandmother   . Myocardial Infarction (Heart attack) Paternal Grandfather   . Stroke Paternal Grandfather      SOCIAL HISTORY: Social History          Socioeconomic History  . Marital status: Single    Spouse name:  Not on file  . Number of children: Not on file  . Years of education: Not on file  . Highest education level: Not on file  Occupational History  . Not on file  Tobacco Use  . Smoking status: Former Research scientist (life sciences)  . Smokeless tobacco: Never Used  Vaping Use  . Vaping Use: Never used  Substance and Sexual Activity  . Alcohol use: No    Alcohol/week: 0.0 standard drinks  . Drug use: Not on file  . Sexual activity: Not on file  Other Topics Concern  . Not on file  Social History Narrative  . Not on file   Social Determinants of Health      Financial Resource Strain:   . Difficulty of Paying Living Expenses:   Food Insecurity:   . Worried About Charity fundraiser in the Last Year:   . Arboriculturist in the Last Year:   Transportation Needs:   . Film/video editor (Medical):   Marland Kitchen Lack of Transportation (Non-Medical):       PHYSICAL EXAM:    Vitals:   07/16/19 0814  BP: (!) 133/91  Pulse: 106   Body mass index is 29.69 kg/m. Weight: 92.5 kg (204 lb)   GENERAL: Alert, active, oriented x3  HEENT: Pupils equal reactive to light. Extraocular movements are intact. Sclera clear. Palpebral conjunctiva normal red color.Pharynx clear.  NECK: Supple with no palpable mass and no adenopathy.  LUNGS: Sound clear with no rales rhonchi or wheezes.  HEART: Regular rhythm S1 and S2 without murmur.  ABDOMEN: Soft and depressible, nontender with no palpable mass, no hepatomegaly.   EXTREMITIES: Well-developed well-nourished symmetrical with no dependent edema.  NEUROLOGICAL: Awake alert oriented, facial expression symmetrical, moving all extremities.  REVIEW OF DATA: I have reviewed the following data today:      No visits with results within 3 Month(s) from this visit.  Latest known visit with results is:  Office Visit on 07/02/2018  Component Date Value  . Color 07/02/2018 Yellow   . Clarity 07/02/2018 Clear   . Specific Gravity 07/02/2018 1.010   . pH,  Urine 07/02/2018 6.5   . Protein, Urinalysis 07/02/2018 Negative   . Glucose, Urinalysis 07/02/2018 Negative   . Ketones, Urinalysis 07/02/2018 Negative   . Blood, Urinalysis 07/02/2018 Negative   . Nitrite, Urinalysis 07/02/2018 Negative   . Leukocyte Esterase, Urin* 07/02/2018 Negative      ASSESSMENT: Ms. Ucci is a 49 y.o. female presenting for evaluation of history of appendicitis with abscess.  Patient with previous history of appendicitis with abscess treated with IV antibiotics and percutaneous drainage.  Her course was significantly complicated due to initial reactive ileus and an medial abscess that was difficult to drain percutaneously.  Eventually was able to be drained and patient improved clinically.  Follow-up study shows resolution of the abscess and no fistulous tract.  Drain was removed 2 days ago.  The patient reported that she has been doing well.  The pain is minimal.  Patient is tolerating regular diet.  We discussed about the alternative of having elective interval appendectomy.  I discussed with her the technical aspect of the surgery that includes robotic assisted laparoscopic appendectomy.  I also discussed with the patient the possibility of needing to remove more of her colon if the area of the seeking does not look healthy enough to just remove the appendix.  I discussed with the pain and the possible risks of surgery that includes bleeding, infection, leak from the appendectomy stump or anastomosis, intra-abdominal abscess, ileus, bowel perforation, injury to adjacent organs, among others.  Patient reports he understood and agreed to proceed.  Appendicitis with abscess [K35.33]  PLAN: 1. Robotic assisted laparoscopic appendectomy (72536) 2. Avoid taking aspirin 5 days before the surgery 3. Bowel prep the day before the surgery 4. Contact us if has any question or concern.   Patient verbalized understanding, all questions were answered, and were agreeable  with the plan outlined above.     Herbert Pun, MD  Electronically signed by Herbert Pun, MD

## 2019-07-27 ENCOUNTER — Encounter
Admission: RE | Admit: 2019-07-27 | Discharge: 2019-07-27 | Disposition: A | Discharge status: Home / Self Care | Payer: BC Managed Care – PPO | Source: Ambulatory Visit | Attending: General Surgery | Admitting: General Surgery

## 2019-07-27 ENCOUNTER — Other Ambulatory Visit: Payer: Self-pay

## 2019-07-27 DIAGNOSIS — Z01818 Encounter for other preprocedural examination: Secondary | ICD-10-CM | POA: Diagnosis not present

## 2019-07-27 NOTE — Patient Instructions (Addendum)
Your procedure is scheduled on:Mon 6/28  Report to Day Surgery. To find out your arrival time please call [phone] between 1PM - 3PM on.Friday 6/25  Remember: Instructions that are not followed completely may result in serious medical risk,  up to and including death, or upon the discretion of your surgeon and anesthesiologist your  surgery may need to be rescheduled.     _X__ 1. Do not eat food after midnight the night before your procedure.                 No gum chewing or hard candies. You may drink clear liquids up to 2 hours                 before you are scheduled to arrive for your surgery- DO not drink clear                 liquids within 2 hours of the start of your surgery.                 Clear Liquids include:  water, apple juice without pulp, clear Gatorade, G2 or                  Gatorade Zero (avoid Red/Purple/Blue), Black Coffee or Tea (Do not add                 anything to coffee or tea). _____2.   Complete the carbohydrate drink provided to you, 2 hours before arrival.  __X__2.  On the morning of surgery brush your teeth with toothpaste and water, you                may rinse your mouth with mouthwash if you wish.  Do not swallow any toothpaste of mouthwash.     _X__ 3.  No Alcohol for 24 hours before or after surgery.   ___ 4.  Do Not Smoke or use e-cigarettes For 24 Hours Prior to Your Surgery.                 Do not use any chewable tobacco products for at least 6 hours prior to                 Surgery. ___  5.  Do not use any recreational drugs (marijuana, cocaine, heroin, ecstacy, MDMA or other)                For at least one week prior to your surgery.  Combination of these drugs with anesthesia                May have life threatening results.  ____  6.  Bring all medications with you on the day of surgery if instructed.   _x___  7.  Notify your doctor if there is any change in your medical condition      (cold, fever,  infections).     Do not wear jewelry, make-up, hairpins, clips or nail polish. Do not wear lotions, powders, or perfumes. You may wear deodorant. Do not shave 48 hours prior to surgery. Do not bring valuables to the hospital.    [facility] is not responsible for any belongings or valuables.  Contacts, dentures or bridgework may not be worn into surgery. Leave your suitcase in the car. After surgery it may be brought to your room. For patients admitted to the hospital, discharge time is determined by your treatment team.   Patients discharged the day of  surgery will not be allowed to drive home.   Make arrangements for someone to be with you for the first 24 hours of your Same Day Discharge.    Please read over the following fact sheets that you were given:    _x___ Take these medicines the morning of surgery with A SIP OF WATER:    1. fexofenadine (ALLEGRA) 180 MG tablet  2.   3.   4.  5.  6.  ____ Fleet Enema (as directed)   __x__ Use CHG Soap (or wipes) as directed  ____ Use Benzoyl Peroxide Gel as instructed  ____ Use inhalers on the day of surgery  ____ Stop metformin 2 days prior to surgery    ____ Take 1/2 of usual insulin dose the night before surgery. No insulin the morning          of surgery.   ____ Stop Coumadin/Plavix/aspirin on   _x___ Stop Anti-inflammatories aleve and ibuprofen and aspirin products today    _x__ Stop supplements until after surgery.  TURMERIC PO  ____ Bring C-Pap to the hospital.   Stool softener twice daily after surgery to prevent constipation Splint abd to reduce pain with coughing and deep breathing after surgery

## 2019-07-29 ENCOUNTER — Other Ambulatory Visit: Payer: Self-pay

## 2019-07-29 ENCOUNTER — Other Ambulatory Visit
Admission: RE | Admit: 2019-07-29 | Discharge: 2019-07-29 | Disposition: A | Discharge status: Home / Self Care | Payer: BC Managed Care – PPO | Source: Ambulatory Visit | Attending: General Surgery | Admitting: General Surgery

## 2019-07-29 DIAGNOSIS — Z01812 Encounter for preprocedural laboratory examination: Secondary | ICD-10-CM | POA: Insufficient documentation

## 2019-07-29 DIAGNOSIS — Z20822 Contact with and (suspected) exposure to covid-19: Secondary | ICD-10-CM | POA: Diagnosis not present

## 2019-07-29 LAB — SARS CORONAVIRUS 2 (TAT 6-24 HRS): SARS Coronavirus 2: NEGATIVE

## 2019-08-01 MED ORDER — SODIUM CHLORIDE 0.9 % IV SOLN
2.0000 g | INTRAVENOUS | Status: AC
  Administered 2019-08-02: 2 g via INTRAVENOUS

## 2019-08-02 ENCOUNTER — Ambulatory Visit
Admission: RE | Admit: 2019-08-02 | Discharge: 2019-08-02 | Disposition: A | Discharge status: Home / Self Care | Payer: BC Managed Care – PPO | Attending: General Surgery | Admitting: General Surgery

## 2019-08-02 ENCOUNTER — Ambulatory Visit: Payer: BC Managed Care – PPO | Admitting: Anesthesiology

## 2019-08-02 ENCOUNTER — Encounter: Payer: Self-pay | Admitting: General Surgery

## 2019-08-02 ENCOUNTER — Encounter
Admission: RE | Disposition: A | Discharge status: Home / Self Care | Payer: Self-pay | Source: Home / Self Care | Attending: General Surgery

## 2019-08-02 ENCOUNTER — Other Ambulatory Visit: Payer: Self-pay

## 2019-08-02 DIAGNOSIS — E669 Obesity, unspecified: Secondary | ICD-10-CM | POA: Diagnosis not present

## 2019-08-02 DIAGNOSIS — K3533 Acute appendicitis with perforation and localized peritonitis, with abscess: Secondary | ICD-10-CM | POA: Diagnosis present

## 2019-08-02 DIAGNOSIS — M069 Rheumatoid arthritis, unspecified: Secondary | ICD-10-CM | POA: Insufficient documentation

## 2019-08-02 DIAGNOSIS — Z6829 Body mass index (BMI) 29.0-29.9, adult: Secondary | ICD-10-CM | POA: Insufficient documentation

## 2019-08-02 DIAGNOSIS — Z87891 Personal history of nicotine dependence: Secondary | ICD-10-CM | POA: Diagnosis not present

## 2019-08-02 HISTORY — PX: XI ROBOTIC LAPAROSCOPIC ASSISTED APPENDECTOMY: SHX6877

## 2019-08-02 LAB — POCT PREGNANCY, URINE: Preg Test, Ur: NEGATIVE

## 2019-08-02 SURGERY — APPENDECTOMY, ROBOT-ASSISTED, LAPAROSCOPIC
Anesthesia: General | Site: Abdomen

## 2019-08-02 MED ORDER — BUPIVACAINE HCL (PF) 0.5 % IJ SOLN
INTRAMUSCULAR | Status: AC
  Filled 2019-08-02: qty 30

## 2019-08-02 MED ORDER — FENTANYL CITRATE (PF) 100 MCG/2ML IJ SOLN
INTRAMUSCULAR | Status: AC
  Administered 2019-08-02: 25 ug via INTRAVENOUS
  Filled 2019-08-02: qty 2

## 2019-08-02 MED ORDER — SUGAMMADEX SODIUM 500 MG/5ML IV SOLN
INTRAVENOUS | Status: AC
  Filled 2019-08-02: qty 5

## 2019-08-02 MED ORDER — ACETAMINOPHEN 10 MG/ML IV SOLN: 1000.0000 mg | Freq: Once | INTRAVENOUS | Status: DC | PRN

## 2019-08-02 MED ORDER — FENTANYL CITRATE (PF) 100 MCG/2ML IJ SOLN
25.0000 ug | INTRAMUSCULAR | Status: DC | PRN
  Administered 2019-08-02: 25 ug via INTRAVENOUS

## 2019-08-02 MED ORDER — FENTANYL CITRATE (PF) 100 MCG/2ML IJ SOLN
INTRAMUSCULAR | Status: AC
  Filled 2019-08-02: qty 2

## 2019-08-02 MED ORDER — ONDANSETRON HCL 4 MG/2ML IJ SOLN
INTRAMUSCULAR | Status: AC
  Filled 2019-08-02: qty 2

## 2019-08-02 MED ORDER — MIDAZOLAM HCL 2 MG/2ML IJ SOLN
INTRAMUSCULAR | Status: DC | PRN
  Administered 2019-08-02: 2 mg via INTRAVENOUS

## 2019-08-02 MED ORDER — OXYCODONE HCL 5 MG PO TABS
5.0000 mg | ORAL_TABLET | Freq: Once | ORAL | Status: AC | PRN
  Administered 2019-08-02: 5 mg via ORAL

## 2019-08-02 MED ORDER — ACETAMINOPHEN 10 MG/ML IV SOLN
INTRAVENOUS | Status: DC | PRN
  Administered 2019-08-02: 1000 mg via INTRAVENOUS

## 2019-08-02 MED ORDER — LIDOCAINE HCL (PF) 2 % IJ SOLN
INTRAMUSCULAR | Status: AC
  Filled 2019-08-02: qty 5

## 2019-08-02 MED ORDER — CHLORHEXIDINE GLUCONATE 0.12 % MT SOLN
OROMUCOSAL | Status: AC
  Administered 2019-08-02: 15 mL via OROMUCOSAL
  Filled 2019-08-02: qty 15

## 2019-08-02 MED ORDER — MIDAZOLAM HCL 2 MG/2ML IJ SOLN
INTRAMUSCULAR | Status: AC
  Filled 2019-08-02: qty 2

## 2019-08-02 MED ORDER — KETOROLAC TROMETHAMINE 30 MG/ML IJ SOLN: 30.0000 mg | Freq: Once | INTRAMUSCULAR | Status: DC

## 2019-08-02 MED ORDER — LIDOCAINE-EPINEPHRINE (PF) 1 %-1:200000 IJ SOLN
INTRAMUSCULAR | Status: AC
  Filled 2019-08-02: qty 30

## 2019-08-02 MED ORDER — KETOROLAC TROMETHAMINE 30 MG/ML IJ SOLN
INTRAMUSCULAR | Status: AC
  Administered 2019-08-02: 30 mg
  Filled 2019-08-02: qty 1

## 2019-08-02 MED ORDER — OXYCODONE HCL 5 MG PO TABS
ORAL_TABLET | ORAL | Status: AC
  Filled 2019-08-02: qty 1

## 2019-08-02 MED ORDER — FAMOTIDINE 20 MG PO TABS
ORAL_TABLET | ORAL | Status: AC
  Administered 2019-08-02: 20 mg via ORAL
  Filled 2019-08-02: qty 1

## 2019-08-02 MED ORDER — PROPOFOL 10 MG/ML IV BOLUS
INTRAVENOUS | Status: AC
  Filled 2019-08-02: qty 40

## 2019-08-02 MED ORDER — ACETAMINOPHEN 10 MG/ML IV SOLN
INTRAVENOUS | Status: AC
  Filled 2019-08-02: qty 100

## 2019-08-02 MED ORDER — LIDOCAINE-EPINEPHRINE (PF) 1 %-1:200000 IJ SOLN
INTRAMUSCULAR | Status: DC | PRN
  Administered 2019-08-02: 30 mL

## 2019-08-02 MED ORDER — ORAL CARE MOUTH RINSE: 15.0000 mL | Freq: Once | OROMUCOSAL | Status: AC

## 2019-08-02 MED ORDER — HYDROCODONE-ACETAMINOPHEN 5-325 MG PO TABS: 1.0000 | ORAL_TABLET | ORAL | 0 refills | Status: AC | PRN

## 2019-08-02 MED ORDER — DEXAMETHASONE SODIUM PHOSPHATE 10 MG/ML IJ SOLN
INTRAMUSCULAR | Status: DC | PRN
  Administered 2019-08-02: 5 mg via INTRAVENOUS

## 2019-08-02 MED ORDER — ROCURONIUM BROMIDE 10 MG/ML (PF) SYRINGE
PREFILLED_SYRINGE | INTRAVENOUS | Status: AC
  Filled 2019-08-02: qty 10

## 2019-08-02 MED ORDER — SODIUM CHLORIDE 0.9 % IV SOLN
INTRAVENOUS | Status: AC
  Filled 2019-08-02: qty 2

## 2019-08-02 MED ORDER — OXYCODONE HCL 5 MG/5ML PO SOLN: 5.0000 mg | Freq: Once | ORAL | Status: AC | PRN

## 2019-08-02 MED ORDER — FAMOTIDINE 20 MG PO TABS: 20.0000 mg | ORAL_TABLET | Freq: Once | ORAL | Status: AC

## 2019-08-02 MED ORDER — FENTANYL CITRATE (PF) 100 MCG/2ML IJ SOLN
INTRAMUSCULAR | Status: DC | PRN
  Administered 2019-08-02 (×4): 50 ug via INTRAVENOUS

## 2019-08-02 MED ORDER — EPHEDRINE SULFATE 50 MG/ML IJ SOLN
INTRAMUSCULAR | Status: DC | PRN
  Administered 2019-08-02: 10 mg via INTRAVENOUS
  Administered 2019-08-02 (×2): 5 mg via INTRAVENOUS

## 2019-08-02 MED ORDER — CHLORHEXIDINE GLUCONATE 0.12 % MT SOLN: 15.0000 mL | Freq: Once | OROMUCOSAL | Status: AC

## 2019-08-02 MED ORDER — DEXAMETHASONE SODIUM PHOSPHATE 10 MG/ML IJ SOLN
INTRAMUSCULAR | Status: AC
  Filled 2019-08-02: qty 1

## 2019-08-02 MED ORDER — SUGAMMADEX SODIUM 200 MG/2ML IV SOLN
INTRAVENOUS | Status: DC | PRN
  Administered 2019-08-02: 200 mg via INTRAVENOUS

## 2019-08-02 MED ORDER — PROPOFOL 10 MG/ML IV BOLUS
INTRAVENOUS | Status: DC | PRN
  Administered 2019-08-02: 150 mg via INTRAVENOUS
  Administered 2019-08-02: 50 mg via INTRAVENOUS

## 2019-08-02 MED ORDER — LIDOCAINE HCL (CARDIAC) PF 100 MG/5ML IV SOSY
PREFILLED_SYRINGE | INTRAVENOUS | Status: DC | PRN
  Administered 2019-08-02: 100 mg via INTRAVENOUS

## 2019-08-02 MED ORDER — ONDANSETRON HCL 4 MG/2ML IJ SOLN
INTRAMUSCULAR | Status: DC | PRN
  Administered 2019-08-02: 4 mg via INTRAVENOUS

## 2019-08-02 MED ORDER — EPHEDRINE 5 MG/ML INJ
INTRAVENOUS | Status: AC
  Filled 2019-08-02: qty 10

## 2019-08-02 MED ORDER — ONDANSETRON HCL 4 MG/2ML IJ SOLN: 4.0000 mg | Freq: Once | INTRAMUSCULAR | Status: DC | PRN

## 2019-08-02 MED ORDER — LACTATED RINGERS IV SOLN: INTRAVENOUS | Status: DC

## 2019-08-02 MED ORDER — ROCURONIUM BROMIDE 100 MG/10ML IV SOLN
INTRAVENOUS | Status: DC | PRN
  Administered 2019-08-02: 40 mg via INTRAVENOUS
  Administered 2019-08-02: 10 mg via INTRAVENOUS

## 2019-08-02 SURGICAL SUPPLY — 70 items
ANCHOR TIS RET SYS 235ML (MISCELLANEOUS) IMPLANT
BAG INFUSER PRESSURE 100CC (MISCELLANEOUS) IMPLANT
BLADE SURG SZ11 CARB STEEL (BLADE) ×3 IMPLANT
CANISTER SUCT 1200ML W/VALVE (MISCELLANEOUS) ×3 IMPLANT
CANNULA REDUC XI 12-8 STAPL (CANNULA) ×1
CANNULA REDUC XI 12-8MM STAPL (CANNULA) ×1
CANNULA REDUCER 12-8 DVNC XI (CANNULA) ×1 IMPLANT
CHLORAPREP W/TINT 26 (MISCELLANEOUS) ×3 IMPLANT
COVER TIP SHEARS 8 DVNC (MISCELLANEOUS) ×1 IMPLANT
COVER TIP SHEARS 8MM DA VINCI (MISCELLANEOUS) ×2
COVER WAND RF STERILE (DRAPES) IMPLANT
DEFOGGER SCOPE WARMER CLEARIFY (MISCELLANEOUS) ×3 IMPLANT
DERMABOND ADVANCED (GAUZE/BANDAGES/DRESSINGS) ×2
DERMABOND ADVANCED .7 DNX12 (GAUZE/BANDAGES/DRESSINGS) ×1 IMPLANT
DRAPE ARM DVNC X/XI (DISPOSABLE) ×4 IMPLANT
DRAPE COLUMN DVNC XI (DISPOSABLE) ×1 IMPLANT
DRAPE DA VINCI XI ARM (DISPOSABLE) ×8
DRAPE DA VINCI XI COLUMN (DISPOSABLE) ×2
ELECT CAUTERY BLADE 6.4 (BLADE) IMPLANT
ELECT REM PT RETURN 9FT ADLT (ELECTROSURGICAL) ×3
ELECTRODE REM PT RTRN 9FT ADLT (ELECTROSURGICAL) ×1 IMPLANT
GLOVE BIOGEL PI IND STRL 7.0 (GLOVE) ×4 IMPLANT
GLOVE BIOGEL PI INDICATOR 7.0 (GLOVE) ×8
GLOVE SURG SYN 6.5 ES PF (GLOVE) ×12 IMPLANT
GOWN STRL REUS W/ TWL LRG LVL3 (GOWN DISPOSABLE) ×4 IMPLANT
GOWN STRL REUS W/TWL LRG LVL3 (GOWN DISPOSABLE) ×8
GRASPER SUT TROCAR 14GX15 (MISCELLANEOUS) IMPLANT
IRRIGATOR SUCT 8 DISP DVNC XI (IRRIGATION / IRRIGATOR) IMPLANT
IRRIGATOR SUCTION 8MM XI DISP (IRRIGATION / IRRIGATOR)
IV NS 1000ML (IV SOLUTION)
IV NS 1000ML BAXH (IV SOLUTION) IMPLANT
KIT PINK PAD W/HEAD ARE REST (MISCELLANEOUS) ×3
KIT PINK PAD W/HEAD ARM REST (MISCELLANEOUS) ×1 IMPLANT
LABEL OR SOLS (LABEL) IMPLANT
NEEDLE HYPO 22GX1.5 SAFETY (NEEDLE) ×3 IMPLANT
NEEDLE INSUFFLATION 14GA 120MM (NEEDLE) ×3 IMPLANT
OBTURATOR OPTICAL STANDARD 8MM (TROCAR) ×2
OBTURATOR OPTICAL STND 8 DVNC (TROCAR) ×1
OBTURATOR OPTICALSTD 8 DVNC (TROCAR) ×1 IMPLANT
PACK LAP CHOLECYSTECTOMY (MISCELLANEOUS) ×3 IMPLANT
PENCIL ELECTRO HAND CTR (MISCELLANEOUS) ×3 IMPLANT
POUCH SPECIMEN RETRIEVAL 10MM (ENDOMECHANICALS) ×3 IMPLANT
RELOAD STAPLER 2.5X45 WHT DVNC (STAPLE) IMPLANT
RELOAD STAPLER 3.5X45 BLU DVNC (STAPLE) IMPLANT
RELOAD STAPLER 3.5X60 BLU DVNC (STAPLE) ×2 IMPLANT
SEAL CANN UNIV 5-8 DVNC XI (MISCELLANEOUS) ×3 IMPLANT
SEAL XI 5MM-8MM UNIVERSAL (MISCELLANEOUS) ×6
SEALER VESSEL DA VINCI XI (MISCELLANEOUS) ×2
SEALER VESSEL EXT DVNC XI (MISCELLANEOUS) ×1 IMPLANT
SET TUBE SMOKE EVAC HIGH FLOW (TUBING) ×3 IMPLANT
SOLUTION ELECTROLUBE (MISCELLANEOUS) ×3 IMPLANT
STAPLER 45 DA VINCI SURE FORM (STAPLE)
STAPLER 45 SUREFORM DVNC (STAPLE) IMPLANT
STAPLER 60 DA VINCI SURE FORM (STAPLE) ×2
STAPLER 60 SUREFORM DVNC (STAPLE) ×1 IMPLANT
STAPLER CANNULA SEAL DVNC XI (STAPLE) ×1 IMPLANT
STAPLER CANNULA SEAL XI (STAPLE) ×2
STAPLER RELOAD 2.5X45 WHITE (STAPLE)
STAPLER RELOAD 2.5X45 WHT DVNC (STAPLE)
STAPLER RELOAD 3.5X45 BLU DVNC (STAPLE)
STAPLER RELOAD 3.5X45 BLUE (STAPLE)
STAPLER RELOAD 3.5X60 BLU DVNC (STAPLE) ×2
STAPLER RELOAD 3.5X60 BLUE (STAPLE) ×4
SUT MNCRL AB 4-0 PS2 18 (SUTURE) ×3 IMPLANT
SUT VIC AB 3-0 SH 27 (SUTURE) ×2
SUT VIC AB 3-0 SH 27X BRD (SUTURE) ×1 IMPLANT
SUT VICRYL 0 AB UR-6 (SUTURE) ×3 IMPLANT
SYR 30ML LL (SYRINGE) ×3 IMPLANT
SYSTEM WECK SHIELD CLOSURE (TROCAR) IMPLANT
TRAY FOLEY MTR SLVR 16FR STAT (SET/KITS/TRAYS/PACK) ×3 IMPLANT

## 2019-08-02 NOTE — Transfer of Care (Signed)
Immediate Anesthesia Transfer of Care Note  Patient: Kerri Sullivan  Procedure(s) Performed: XI ROBOTIC LAPAROSCOPIC ASSISTED APPENDECTOMY (N/A Abdomen)  Patient Location: PACU  Anesthesia Type:General  Level of Consciousness: awake, alert  and oriented  Airway & Oxygen Therapy: Patient Spontanous Breathing  Post-op Assessment: Report given to RN and Post -op Vital signs reviewed and stable  Post vital signs: Reviewed and stable  Last Vitals:  Vitals Value Taken Time  BP    Temp    Pulse    Resp    SpO2      Last Pain:  Vitals:   08/02/19 0627  PainSc: 0-No pain         Complications: No complications documented.

## 2019-08-02 NOTE — Op Note (Signed)
Pre-op Diagnosis: Chronic appendicitis                                   History of appendicitis with abscess   Post op Diagnosis: Acute appendicitis                                   History of appendicitis with abscess   Procedure: Robotic assisted laparoscopic appendectomy.                      Extensive lysis of adhesions  Anesthesia: GETA  Surgeon: Herbert Pun, MD, FACS  Wound Classification: Clean contaminated  Specimen: Appendix  Complications: None  Estimated Blood Loss: 5 mL   Indications: Patient is a 49 y.o. female  presented with history of acute appendicitis with abscess treated with percutaneous drainage and IV antibiotic therapy with prolonged hospitalization. Patient continue with persistent right lower quadrant pain. CT scan shows resolution of abscess.     FIndings: 1. Chronic inflamed appendix 2. No peri-appendiceal abscess or phlegmon 3. Large amount of adhesions in the right lower quadrant.  4. Adequate hemostasis.   Description of procedure: The patient was placed on the operating table in the supine position. General anesthesia was induced. A time-out was completed verifying correct patient, procedure, site, positioning, and implant(s) and/or special equipment prior to beginning this procedure. The abdomen was prepped and draped in the usual sterile fashion.   Palmer's point located and Veress needle was inserted.  After confirming 2 clicks and a positive saline drop test, gas insufflation was initiated until the abdominal pressure was measured at 15 mmHg.  Afterwards, the Veress needle was removed and a 8 mm port was placed in left upper quadrant area using Optiview technique.  After local was infused, an additional incision was made 8 cm apart along the left side of the abdominal wall from the initial incision.  An 12 mm port was placed at the left lower quadrant port under direct visualization.  Camera was switched over to this new port and the right  upper quadrant incision was extended yet again to 8 mm port was placed under direct visualization.  No injuries from trocar placements were noted. The table was placed in the Trendelenburg position with the right side elevated.  Time consuming lysis of adhesions was done. There were severe adhesions between omentum, large bowel and small bowel. All this adhesions were lysed taking a lot of time to be able to restore normal anatomy. With the use of Tip up grasper, Force Bipolar and Vessel sealer, an inflamed appendix was finally identified and elevated.  Window created at base of appendix in the mesentery.   An  blue load linear cutting stapler was then used to divide and staple the base of the appendix. Mesoappendix was divided with Vessel sealer. The appendix was placed in an endoscopic retrieval bag and removed.   The appendiceal stump was examined and hemostasis noted. No other pathology was identified within pelvis. The 12 mm trocar removed and port site closed with PMI using 0 vicryl under direct vision. Remaining trocars were removed under direct vision. No bleeding was noted.The abdomen was allowed to collapse.  All skin incisions then closed with subcuticular sutures Monocryl 4-0.  Wounds then dressed with dermabond.  The patient tolerated the procedure well, awakened from anesthesia and  was taken to the postanesthesia care unit in satisfactory condition.  Sponge count and instrument count correct at the end of the procedure.

## 2019-08-02 NOTE — Anesthesia Preprocedure Evaluation (Signed)
Anesthesia Evaluation  Patient identified by MRN, date of birth, ID band Patient awake    Reviewed: Allergy & Precautions, NPO status , Patient's Chart, lab work & pertinent test results  History of Anesthesia Complications Negative for: history of anesthetic complications  Airway Mallampati: II  TM Distance: >3 FB Neck ROM: Full    Dental no notable dental hx. (+) Teeth Intact, Dental Advisory Given   Pulmonary neg pulmonary ROS, neg sleep apnea, neg COPD, Patient abstained from smoking.Not current smoker,    Pulmonary exam normal breath sounds clear to auscultation       Cardiovascular Exercise Tolerance: Good METS(-) hypertension(-) CAD and (-) Past MI negative cardio ROS  (-) dysrhythmias  Rhythm:Regular Rate:Normal - Systolic murmurs    Neuro/Psych negative neurological ROS  negative psych ROS   GI/Hepatic neg GERD  ,(+)     (-) substance abuse  ,   Endo/Other  neg diabetes  Renal/GU negative Renal ROS     Musculoskeletal  (+) Arthritis , Rheumatoid disorders,  No neck involvement   Abdominal   Peds  Hematology   Anesthesia Other Findings Past Medical History: No date: Arthritis     Comment:  rheumatoid arthritis-OFF METHOTREXATE SINCE 2016 AND               DOING WELL PER PT (09-13-15) No date: Asthma     Comment:  high school No date: Family history of adverse reaction to anesthesia     Comment:  MOM-N/V No date: History of abnormal cervical Pap smear  Reproductive/Obstetrics                             Anesthesia Physical Anesthesia Plan  ASA: II  Anesthesia Plan: General   Post-op Pain Management:    Induction: Intravenous  PONV Risk Score and Plan: 4 or greater and Ondansetron, Dexamethasone and Midazolam  Airway Management Planned: Oral ETT  Additional Equipment: None  Intra-op Plan:   Post-operative Plan: Extubation in OR  Informed Consent: I have  reviewed the patients History and Physical, chart, labs and discussed the procedure including the risks, benefits and alternatives for the proposed anesthesia with the patient or authorized representative who has indicated his/her understanding and acceptance.     Dental advisory given  Plan Discussed with: CRNA and Surgeon  Anesthesia Plan Comments: (Discussed risks of anesthesia with patient, including PONV, sore throat, lip/dental damage. Rare risks discussed as well, such as cardiorespiratory and neurological sequelae. Patient understands.)        Anesthesia Quick Evaluation

## 2019-08-02 NOTE — Discharge Instructions (Addendum)
YOU RECEIVED 1 PAIN PILL (OXYCODONE) AT [facility] AT 10:23 AM.  Diet: Resume home heart healthy regular diet.   Activity: No heavy lifting >20 pounds (children, pets, laundry, garbage) or strenuous activity until follow-up, but light activity and walking are encouraged. Do not drive or drink alcohol if taking narcotic pain medications.  Wound care: May shower with soapy water and pat dry (do not rub incisions), but no baths or submerging incision underwater until follow-up. (no swimming)   Medications: Resume all home medications. For mild to moderate pain: acetaminophen (Tylenol) or ibuprofen (if no kidney disease). Combining Tylenol with alcohol can substantially increase your risk of causing liver disease. Narcotic pain medications, if prescribed, can be used for severe pain, though may cause nausea, constipation, and drowsiness. Do not combine Tylenol and Norco within a 6 hour period as Norco contains Tylenol. If you do not need the narcotic pain medication, you do not need to fill the prescription.  Call office [phone]) at any time if any questions, worsening pain, fevers/chills, bleeding, drainage from incision site, or other concerns.    AMBULATORY SURGERY  DISCHARGE INSTRUCTIONS   1) The drugs that you were given will stay in your system until tomorrow so for the next 24 hours you should not:  A) Drive an automobile B) Make any legal decisions C) Drink any alcoholic beverage   2) You may resume regular meals tomorrow.  Today it is better to start with liquids and gradually work up to solid foods.  You may eat anything you prefer, but it is better to start with liquids, then soup and crackers, and gradually work up to solid foods.   3) Please notify your doctor immediately if you have any unusual bleeding, trouble breathing, redness and pain at the surgery site, drainage, fever, or pain not relieved by medication.    4) Additional Instructions:        Please  contact your physician with any problems or Same Day Surgery at [phone], Monday through Friday 6 am to 4 pm, or [place] at [facility] number at [phone].

## 2019-08-02 NOTE — Anesthesia Postprocedure Evaluation (Signed)
Anesthesia Post Note  Patient: Kerri Sullivan  Procedure(s) Performed: XI ROBOTIC LAPAROSCOPIC ASSISTED APPENDECTOMY (N/A Abdomen)  Patient location during evaluation: PACU Anesthesia Type: General Level of consciousness: awake and alert Pain management: pain level controlled Vital Signs Assessment: post-procedure vital signs reviewed and stable Respiratory status: spontaneous breathing, nonlabored ventilation, respiratory function stable and patient connected to nasal cannula oxygen Cardiovascular status: blood pressure returned to baseline and stable Postop Assessment: no apparent nausea or vomiting Anesthetic complications: no   No complications documented.   Last Vitals:  Vitals:   08/02/19 1100 08/02/19 1123  BP: 120/68 137/76  Pulse: 88 89  Resp: 18 16  Temp: (!) 36.3 C 36.8 C  SpO2: 93% 98%    Last Pain:  Vitals:   08/02/19 1123  TempSrc: Temporal  PainSc: 4                  Arita Miss

## 2019-08-02 NOTE — Anesthesia Procedure Notes (Signed)
Procedure Name: Intubation Date/Time: 08/02/2019 7:39 AM Performed by: Chanetta Marshall, CRNA Pre-anesthesia Checklist: Patient identified, Emergency Drugs available, Suction available and Patient being monitored Patient Re-evaluated:Patient Re-evaluated prior to induction Oxygen Delivery Method: Circle system utilized Preoxygenation: Pre-oxygenation with 100% oxygen Induction Type: IV induction Ventilation: Mask ventilation without difficulty Laryngoscope Size: McGraph and 3 Grade View: Grade I Tube type: Oral Tube size: 6.5 mm Number of attempts: 1 Airway Equipment and Method: Video-laryngoscopy Placement Confirmation: ETT inserted through vocal cords under direct vision,  positive ETCO2,  breath sounds checked- equal and bilateral and CO2 detector Secured at: 21 cm Tube secured with: Tape Dental Injury: Teeth and Oropharynx as per pre-operative assessment

## 2019-08-02 NOTE — Interval H&P Note (Signed)
History and Physical Interval Note:  08/02/2019 6:53 AM  Kerri Sullivan  has presented today for surgery, with the diagnosis of K35.33 Appendicitis w/ abscess.  The various methods of treatment have been discussed with the patient and family. After consideration of risks, benefits and other options for treatment, the patient has consented to  Procedure(s): XI ROBOTIC LAPAROSCOPIC ASSISTED APPENDECTOMY (N/A) as a surgical intervention.  The patient's history has been reviewed, patient examined, no change in status, stable for surgery.  I have reviewed the patient's chart and labs.  Questions were answered to the patient's satisfaction.     Herbert Pun

## 2019-08-03 LAB — SURGICAL PATHOLOGY

## 2020-12-08 DIAGNOSIS — Z23 Encounter for immunization: Secondary | ICD-10-CM | POA: Diagnosis not present

## 2021-10-02 ENCOUNTER — Other Ambulatory Visit: Payer: Self-pay | Admitting: Endocrinology

## 2021-10-02 DIAGNOSIS — Z1231 Encounter for screening mammogram for malignant neoplasm of breast: Secondary | ICD-10-CM

## 2021-10-24 ENCOUNTER — Ambulatory Visit
Admission: RE | Admit: 2021-10-24 | Discharge: 2021-10-24 | Disposition: A | Discharge status: Home / Self Care | Payer: BC Managed Care – PPO | Source: Ambulatory Visit | Attending: Endocrinology | Admitting: Endocrinology

## 2021-10-24 DIAGNOSIS — Z1231 Encounter for screening mammogram for malignant neoplasm of breast: Secondary | ICD-10-CM | POA: Insufficient documentation

## 2021-10-25 ENCOUNTER — Inpatient Hospital Stay
Admission: RE | Admit: 2021-10-25 | Discharge: 2021-10-25 | Disposition: A | Discharge status: Home / Self Care | Payer: Self-pay | Source: Ambulatory Visit | Attending: *Deleted | Admitting: *Deleted

## 2021-10-25 ENCOUNTER — Other Ambulatory Visit: Payer: Self-pay | Admitting: *Deleted

## 2021-10-25 DIAGNOSIS — Z1231 Encounter for screening mammogram for malignant neoplasm of breast: Secondary | ICD-10-CM

## 2021-10-26 ENCOUNTER — Other Ambulatory Visit: Payer: Self-pay | Admitting: Endocrinology

## 2021-10-29 ENCOUNTER — Other Ambulatory Visit: Payer: Self-pay | Admitting: Endocrinology

## 2021-10-31 ENCOUNTER — Other Ambulatory Visit: Payer: Self-pay | Admitting: Endocrinology

## 2021-10-31 DIAGNOSIS — R928 Other abnormal and inconclusive findings on diagnostic imaging of breast: Secondary | ICD-10-CM

## 2021-10-31 DIAGNOSIS — N6489 Other specified disorders of breast: Secondary | ICD-10-CM

## 2021-11-02 IMAGING — CT CT ABD-PELV W/ CM
2 of 4 series · 15 of 46 positions shown, 17 images · IV contrast (iopamidol)
Comparison: 06/26/1998

CLINICAL DATA: Ruptured appendicitis and appendiceal abscess.
Status post percutaneous catheter drainage of appendiceal abscess on
06/26/2019.

EXAM:
CT ABDOMEN AND PELVIS WITH CONTRAST
TECHNIQUE: Multidetector CT imaging of the abdomen and pelvis was performed
using the standard protocol following bolus administration of
intravenous contrast.
CONTRAST:  100mL [id number] IOPAMIDOL ([id number]) INJECTION 61%

[Series 2: abd pelvis 5.00 br40 s3 axial · axial · 0.76mm/px · z∈[+1310,+1755]mm · 12 of 103 slices shown, 14 images]
[im 7/103  soft-tissue]
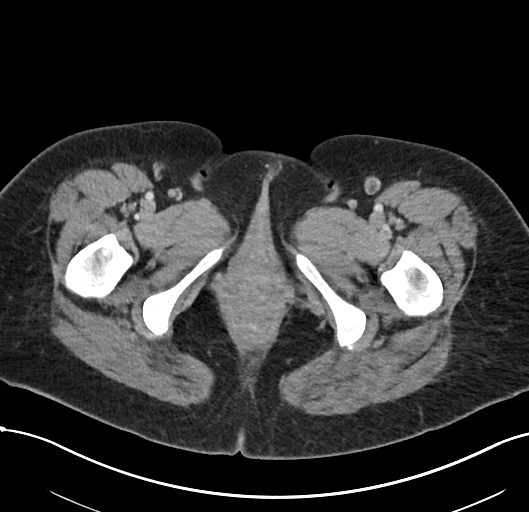
[im 7/103  bone]
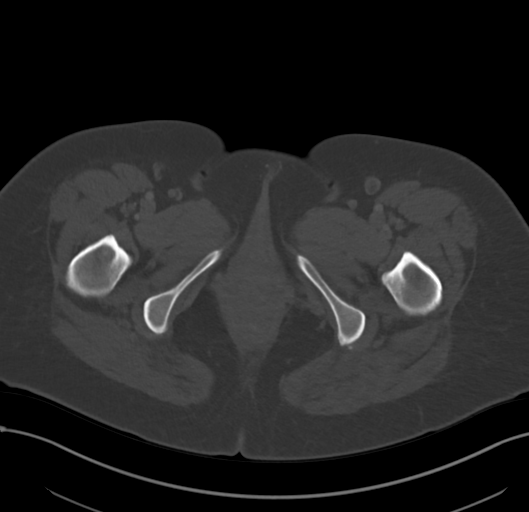
[im 14/103  soft-tissue]
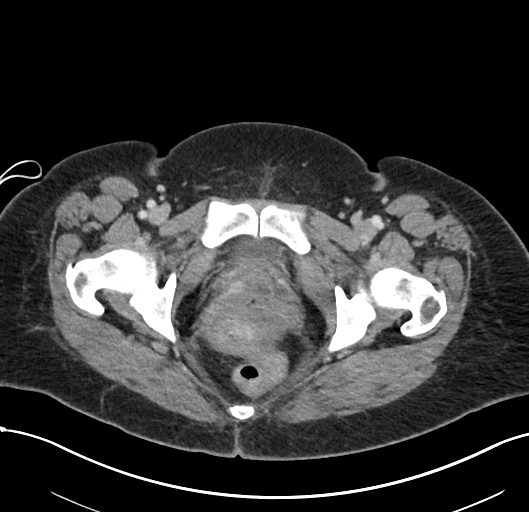
[im 21/103  soft-tissue]
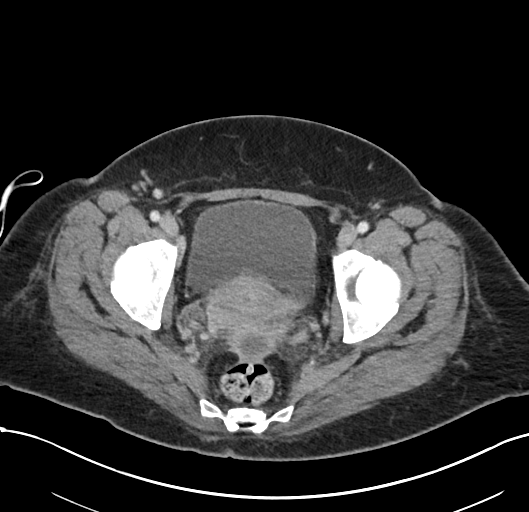
[im 35/103  soft-tissue]
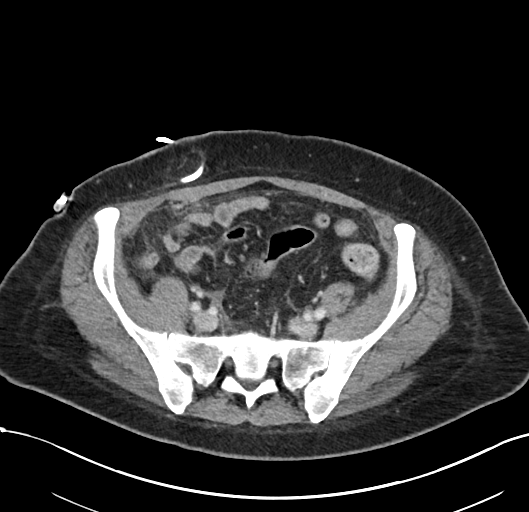
[im 41/103  soft-tissue]
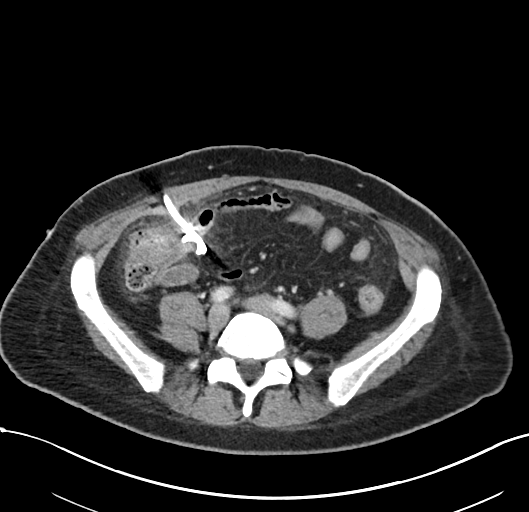
[im 48/103  soft-tissue]
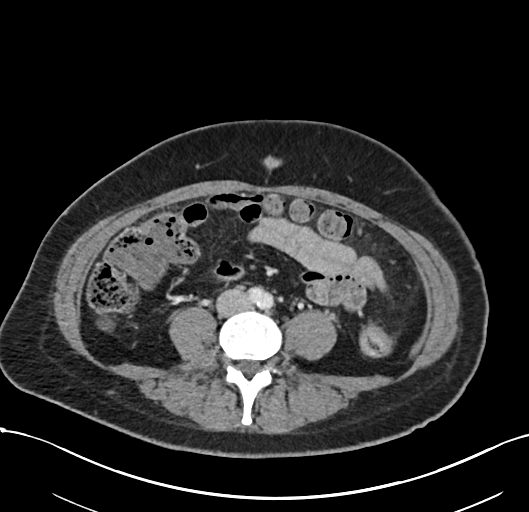
[im 55/103  soft-tissue]
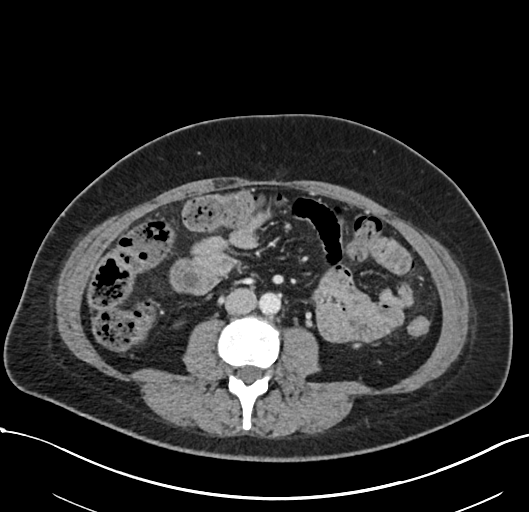
[im 62/103  soft-tissue]
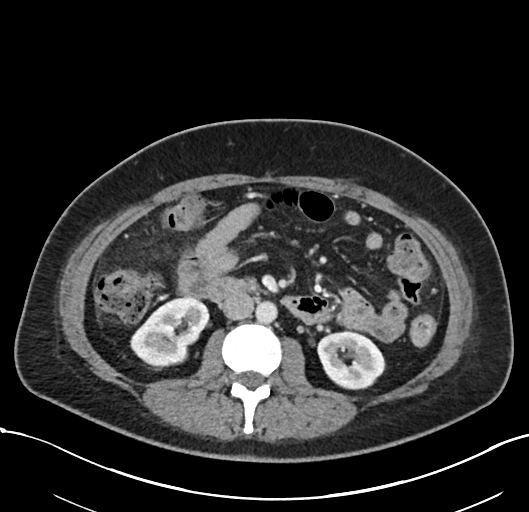
[im 69/103  soft-tissue]
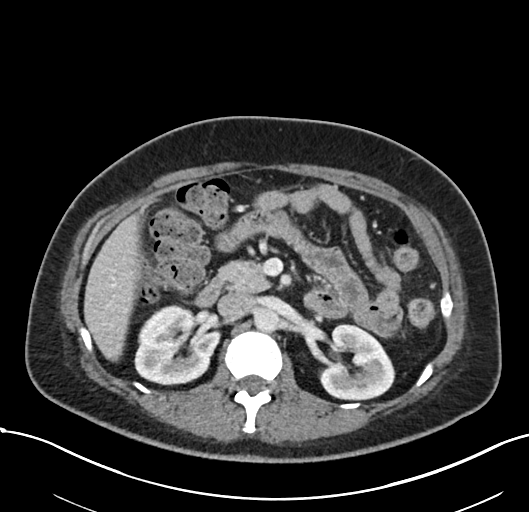
[im 69/103  bone]
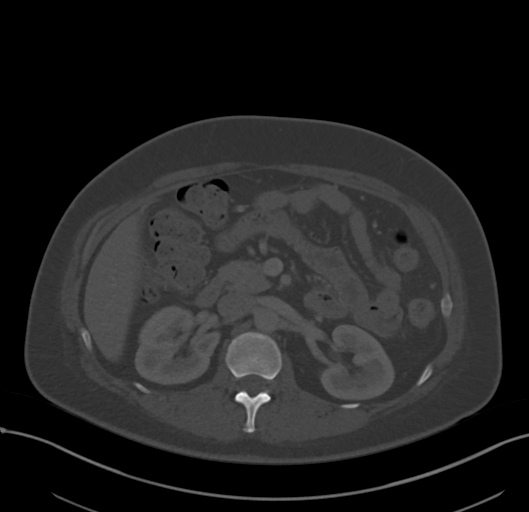
[im 82/103  soft-tissue]
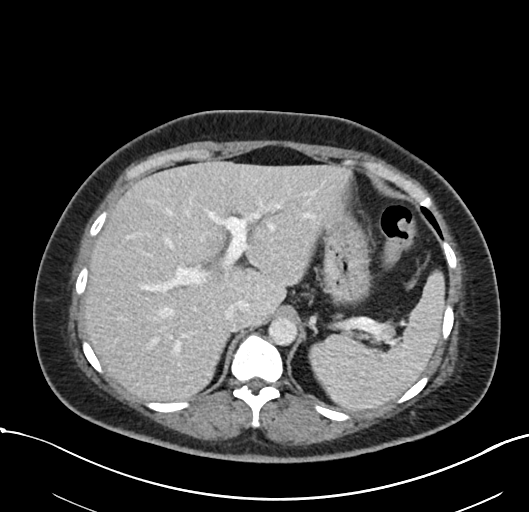
[im 89/103  soft-tissue]
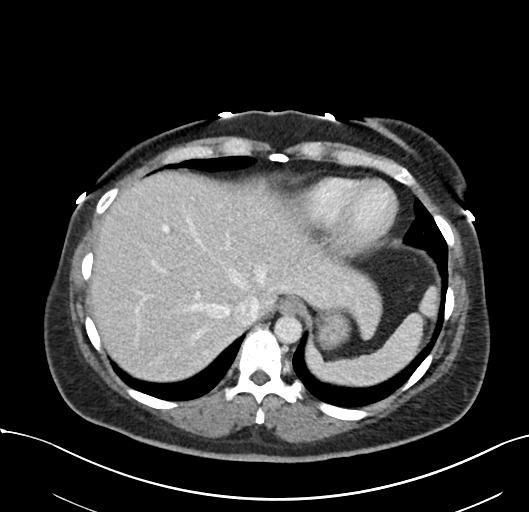
[im 96/103  soft-tissue]
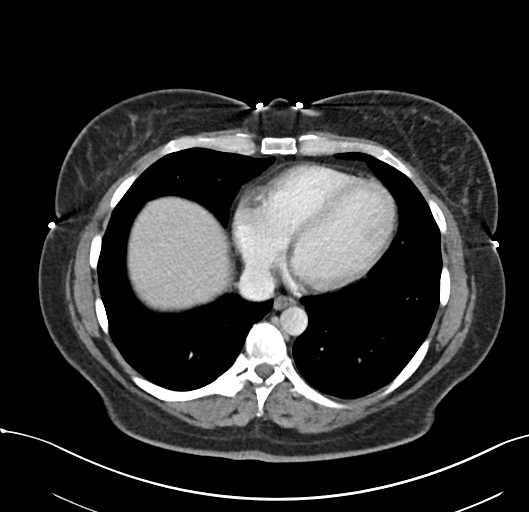

[Series 6: abd pelvis 2.00 br40 s3 cor · coronal · 0.79mm/px · 3 of 148 slices shown]
[im 50/148  soft-tissue]
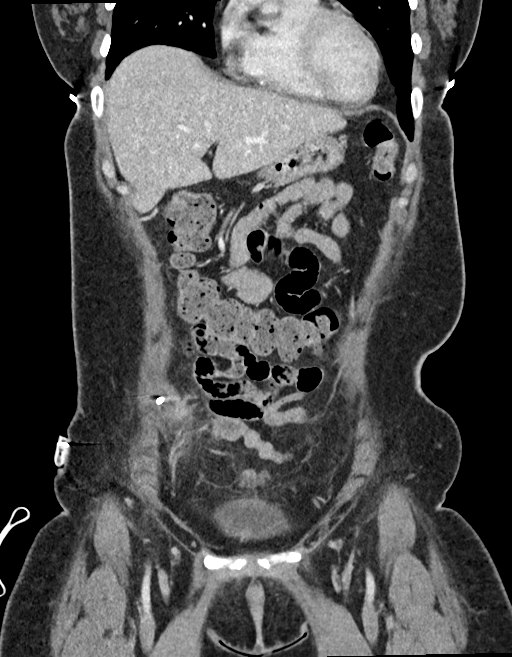
[im 66/148  soft-tissue]
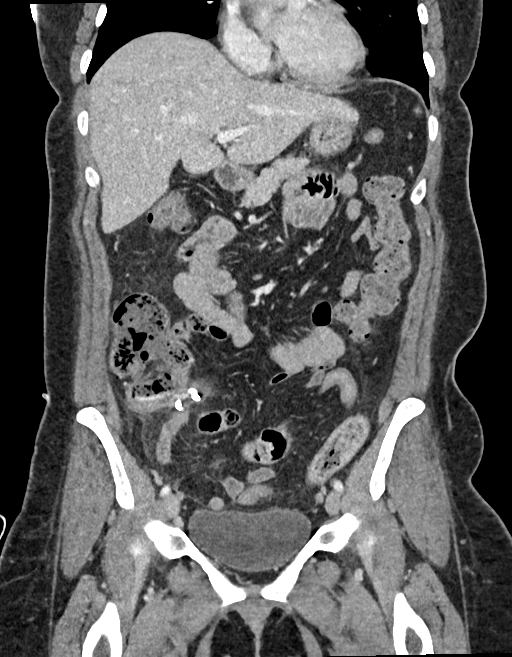
[im 82/148  soft-tissue]
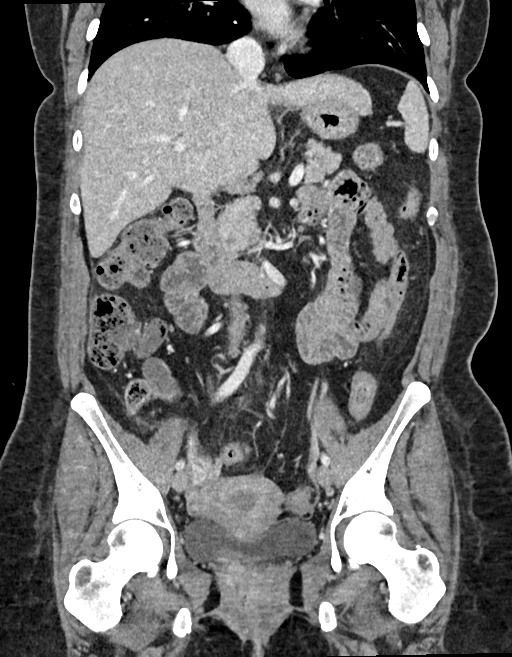

[15 of 46 positions shown; findings below may reference images not displayed]

FINDINGS: Lower chest: No acute abnormality.

Hepatobiliary: No focal liver abnormality is seen. Status post
cholecystectomy. No biliary dilatation.

Pancreas: Unremarkable. No pancreatic ductal dilatation or
surrounding inflammatory changes.

Spleen: Normal in size without focal abnormality.

Adrenals/Urinary Tract: Adrenal glands are unremarkable. Kidneys are
normal, without renal calculi, focal lesion, or hydronephrosis.
Bladder is unremarkable.

Stomach/Bowel: Complete resolution of small bowel ileus. No bowel
obstruction, ileus or free intraperitoneal air. Appendiceal
inflammation demonstrates significant improvement. Proximal appendix
demonstrates mild residual thickening and measures approximately 10
mm in diameter. The mid to distal appendix is normal in caliber.
Slightly hyperdense inspissated material in the proximal appendix
without overtly calcific appendicoliths.

Vascular/Lymphatic: No significant vascular findings are present. No
enlarged abdominal or pelvic lymph nodes.

Reproductive: Uterus and bilateral adnexa are unremarkable.

Other: Percutaneous drain remains present medial to the cecum. Focal
abscess has completely resolved. Free fluid in the pelvis markedly
reduced since the prior scan with some small amount of free fluid
remaining posterior to the uterus in the cul-de-sac in an area
measuring roughly 1.8 x 4.5 cm.

Musculoskeletal: No acute or significant osseous findings.
IMPRESSION: 1. Significant improvement in inflammation of the appendix. There is
mild residual thickening of the proximal appendix measuring 10 mm in
diameter. The mid to distal appendix is normal in caliber.
2. Percutaneous drain remains medial to the cecum. Focal appendiceal
abscess has completely resolved. Free fluid in the pelvis markedly
reduced since the prior scan with some small amount of free fluid
remaining posterior to the uterus in the cul-de-sac in an area
measuring roughly 1.8 x 4.5 cm.
3. Complete resolution of small bowel ileus.
4. Abscess drain injection was performed following the CT to
evaluate for fistula and will be dictated separately.

## 2021-11-05 ENCOUNTER — Ambulatory Visit
Admission: RE | Admit: 2021-11-05 | Discharge: 2021-11-05 | Disposition: A | Discharge status: Home / Self Care | Payer: BC Managed Care – PPO | Source: Ambulatory Visit | Attending: Endocrinology | Admitting: Endocrinology

## 2021-11-05 DIAGNOSIS — R928 Other abnormal and inconclusive findings on diagnostic imaging of breast: Secondary | ICD-10-CM

## 2021-11-05 DIAGNOSIS — N6489 Other specified disorders of breast: Secondary | ICD-10-CM

## 2022-04-30 ENCOUNTER — Encounter: Payer: Self-pay | Admitting: Endocrinology

## 2022-05-02 ENCOUNTER — Other Ambulatory Visit: Payer: Self-pay | Admitting: Endocrinology

## 2022-05-02 DIAGNOSIS — N6489 Other specified disorders of breast: Secondary | ICD-10-CM

## 2022-05-09 ENCOUNTER — Ambulatory Visit
Admission: RE | Admit: 2022-05-09 | Discharge: 2022-05-09 | Disposition: A | Discharge status: Home / Self Care | Payer: BC Managed Care – PPO | Source: Ambulatory Visit | Attending: Endocrinology | Admitting: Endocrinology

## 2022-05-09 DIAGNOSIS — N6489 Other specified disorders of breast: Secondary | ICD-10-CM | POA: Diagnosis present

## 2023-02-25 ENCOUNTER — Ambulatory Visit: Payer: BC Managed Care – PPO | Admitting: Internal Medicine

## 2023-02-27 ENCOUNTER — Ambulatory Visit: Payer: 59 | Admitting: Internal Medicine

## 2023-02-27 VITALS — BP 132/74 | HR 80 | Temp 97.8°F | Resp 18 | Ht 69.5 in | Wt 224.1 lb

## 2023-02-27 DIAGNOSIS — Z23 Encounter for immunization: Secondary | ICD-10-CM | POA: Diagnosis not present

## 2023-02-27 DIAGNOSIS — D649 Anemia, unspecified: Secondary | ICD-10-CM

## 2023-02-27 DIAGNOSIS — Z1231 Encounter for screening mammogram for malignant neoplasm of breast: Secondary | ICD-10-CM

## 2023-02-27 DIAGNOSIS — M069 Rheumatoid arthritis, unspecified: Secondary | ICD-10-CM

## 2023-02-27 DIAGNOSIS — R928 Other abnormal and inconclusive findings on diagnostic imaging of breast: Secondary | ICD-10-CM | POA: Diagnosis not present

## 2023-02-27 DIAGNOSIS — M79646 Pain in unspecified finger(s): Secondary | ICD-10-CM | POA: Diagnosis not present

## 2023-02-27 DIAGNOSIS — Z136 Encounter for screening for cardiovascular disorders: Secondary | ICD-10-CM

## 2023-02-27 DIAGNOSIS — Z1211 Encounter for screening for malignant neoplasm of colon: Secondary | ICD-10-CM

## 2023-02-27 NOTE — Progress Notes (Signed)
Subjective:    Patient ID: Kerri Sullivan, female    DOB: 1970-12-08, 53 y.o.   MRN: [id number]  Patient here for  Chief Complaint  Patient presents with   Establish Care    HPI Here to establish care.  Was seeing Dr Patrecia Pace. Also seeing Dr Amanda Pea for [facility] arthritis (thumb). S/p left thumb surgery. Persistent right thumb pain. Injection prn. In reviewing, she has a documented history of RA. Previously saw Dr Gavin Potters. Overdue mammogram.  In reviewing labs - documented history of anemia. LMP - now. Active. No chest pain or sob reported. No increased cough or congestion. Overdue pap. Previously saw Dr Luella Cook. Never had colonoscopy. Agreeable for referral. Father had heart disease / copd. Smoker. Mother - cva. Son with afib - planning ablation. She has not smoked in 26 years.    Past Medical History:  Diagnosis Date   Arthritis    rheumatoid arthritis-OFF METHOTREXATE SINCE 2016 AND DOING WELL PER PT (09-13-15)   Asthma    high school   Family history of adverse reaction to anesthesia    MOM-N/V   History of abnormal cervical Pap smear    Past Surgical History:  Procedure Laterality Date   APPENDECTOMY  June 2021   CHOLECYSTECTOMY  1998   FOOT SURGERY Left    repair of tendon   IR RADIOLOGIST EVAL & MGMT  07/14/2019   appenditis   JOINT REPLACEMENT  February 2020   Left Thumb   KNEE SURGERY  1987   right x2 and left x 1-arthroscopy   LIPOMA EXCISION N/A 09/21/2015   Procedure: EXCISION LIPOMA BACK OF NECK;  Surgeon: Kieth Brightly, MD;  Location: [facility] ORS;  Service: General;  Laterality: N/A;   XI ROBOTIC LAPAROSCOPIC ASSISTED APPENDECTOMY N/A 08/02/2019   Procedure: XI ROBOTIC LAPAROSCOPIC ASSISTED APPENDECTOMY;  Surgeon: Carolan Shiver, MD;  Location: [facility] ORS;  Service: General;  Laterality: N/A;   Family History  Problem Relation Age of Onset   Osteoporosis Mother    Arthritis Mother    Stroke Mother    Heart disease Father    Hypertension Father     Arthritis Father    COPD Father    Diabetes Brother    Alcoholism Brother    Alcohol abuse Brother    Early death Brother    Suicidality Brother    Early death Brother    Diabetes Maternal Uncle    Cancer Paternal Aunt 30       breast   Diabetes Maternal Grandmother    Stroke Maternal Grandmother    Stroke Paternal Grandmother    Stroke Paternal Grandfather    Cancer Cousin        colon   Lung cancer Cousin        died late 54s   Breast cancer Neg Hx    Social History   Socioeconomic History   Marital status: Divorced    Spouse name: Not on file   Number of children: 1   Years of education: 16   Highest education level: Master's degree (e.g., MA, MS, MEng, MEd, MSW, MBA)  Occupational History   Occupation: Runner, broadcasting/film/video  Tobacco Use   Smoking status: Never   Smokeless tobacco: Never   Tobacco comments:    Quit in 1997  Vaping Use   Vaping status: Never Used  Substance and Sexual Activity   Alcohol use: Yes    Alcohol/week: 1.0 standard drink of alcohol    Types: 1 Shots of liquor per week  Comment: RARE   Drug use: No   Sexual activity: Not Currently    Birth control/protection: None  Other Topics Concern   Not on file  Social History Narrative   Not on file   Social Drivers of Health   Financial Resource Strain: Low Risk  (02/27/2023)   Overall Financial Resource Strain (CARDIA)    Difficulty of Paying Living Expenses: Not very hard  Food Insecurity: No Food Insecurity (02/27/2023)   Hunger Vital Sign    Worried About Running Out of Food in the Last Year: Never true    Ran Out of Food in the Last Year: Never true  Transportation Needs: No Transportation Needs (02/27/2023)   PRAPARE - Administrator, Civil Service (Medical): No    Lack of Transportation (Non-Medical): No  Physical Activity: Insufficiently Active (02/27/2023)   Exercise Vital Sign    Days of Exercise per Week: 2 days    Minutes of Exercise per Session: 20 min  Stress: Stress  Concern Present (02/27/2023)   [employer] of Occupational Health - Occupational Stress Questionnaire    Feeling of Stress : To some extent  Social Connections: Moderately Integrated (02/27/2023)   Social Connection and Isolation Panel [NHANES]    Frequency of Communication with Friends and Family: More than three times a week    Frequency of Social Gatherings with Friends and Family: Three times a week    Attends Religious Services: 1 to 4 times per year    Active Member of Clubs or Organizations: Yes    Attends Engineer, structural: More than 4 times per year    Marital Status: Divorced     Review of Systems  Constitutional:  Negative for appetite change and unexpected weight change.  HENT:  Negative for congestion and sinus pressure.   Respiratory:  Negative for cough, chest tightness and shortness of breath.   Cardiovascular:  Negative for chest pain, palpitations and leg swelling.  Gastrointestinal:  Negative for abdominal pain, diarrhea, nausea and vomiting.  Genitourinary:  Negative for difficulty urinating and dysuria.  Musculoskeletal:  Negative for myalgias.       Thumb pain as outlined.   Skin:  Negative for color change and rash.  Neurological:  Negative for dizziness and headaches.  Psychiatric/Behavioral:  Negative for agitation and dysphoric mood.        Objective:     BP 132/74   Pulse 80   Temp 97.8 F (36.6 C) (Oral)   Resp 18   Ht 5' 9.5" (1.765 m)   Wt 224 lb 2 oz (101.7 kg)   LMP 02/26/2023   SpO2 97%   BMI 32.62 kg/m  Wt Readings from Last 3 Encounters:  02/27/23 224 lb 2 oz (101.7 kg)  07/27/19 204 lb (92.5 kg)  07/04/19 215 lb 13.3 oz (97.9 kg)    Physical Exam Vitals reviewed.  Constitutional:      General: She is not in acute distress.    Appearance: Normal appearance.  HENT:     Head: Normocephalic and atraumatic.     Right Ear: External ear normal.     Left Ear: External ear normal.     Mouth/Throat:     Pharynx: No  oropharyngeal exudate or posterior oropharyngeal erythema.  Eyes:     General: No scleral icterus.       Right eye: No discharge.        Left eye: No discharge.     Conjunctiva/sclera: Conjunctivae normal.  Neck:  Thyroid: No thyromegaly.  Cardiovascular:     Rate and Rhythm: Normal rate and regular rhythm.  Pulmonary:     Effort: No respiratory distress.     Breath sounds: Normal breath sounds. No wheezing.  Abdominal:     General: Bowel sounds are normal.     Palpations: Abdomen is soft.     Tenderness: There is no abdominal tenderness.  Musculoskeletal:        General: No swelling or tenderness.     Cervical back: Neck supple. No tenderness.  Lymphadenopathy:     Cervical: No cervical adenopathy.  Skin:    Findings: No erythema or rash.  Neurological:     Mental Status: She is alert.  Psychiatric:        Mood and Affect: Mood normal.        Behavior: Behavior normal.         Outpatient Encounter Medications as of 02/27/2023  Medication Sig   fexofenadine (ALLEGRA) 180 MG tablet Take 180 mg by mouth daily.   ibuprofen (ADVIL) 200 MG tablet Take 800 mg by mouth every 8 (eight) hours as needed (pain.).   Magnesium 500 MG TABS Take 500 mg by mouth daily.   Multiple Vitamin (MULTIVITAMIN WITH MINERALS) TABS tablet Take 1 tablet by mouth daily.   naproxen sodium (ALEVE) 220 MG tablet Take 440 mg by mouth 2 (two) times daily as needed (pain.).   TURMERIC PO Take 1 tablet by mouth daily.   vitamin B-12 (CYANOCOBALAMIN) 100 MCG tablet Take 100 mcg by mouth daily.   Wheat Dextrin (BENEFIBER DRINK MIX) PACK Take 1 Dose by mouth daily.   [DISCONTINUED] methocarbamol (ROBAXIN) 500 MG tablet Take 1 tablet (500 mg total) by mouth every 6 (six) hours as needed for muscle spasms. (Patient not taking: Reported on 07/19/2019)   No facility-administered encounter medications on file as of 02/27/2023.     Lab Results  Component Value Date   WBC 10.1 02/27/2023   HGB 13.9 02/27/2023    HCT 42.1 02/27/2023   PLT 275.0 02/27/2023   GLUCOSE 85 02/27/2023   CHOL 210 (H) 02/27/2023   TRIG 360.0 (H) 02/27/2023   HDL 52.80 02/27/2023   LDLCALC 85 02/27/2023   ALT 14 02/27/2023   AST 14 02/27/2023   NA 139 02/27/2023   K 4.1 02/27/2023   CL 102 02/27/2023   CREATININE 0.72 02/27/2023   BUN 16 02/27/2023   CO2 29 02/27/2023   TSH 1.16 02/27/2023    MM 3D DIAGNOSTIC MAMMOGRAM UNILATERAL RIGHT BREAST Result Date: 05/09/2022 CLINICAL DATA:  BI-RADS 3 follow-up of a possible complicated cyst noted at 12 o'clock 10 cm from nipple. EXAM: DIGITAL DIAGNOSTIC UNILATERAL RIGHT MAMMOGRAM WITH TOMOSYNTHESIS; ULTRASOUND RIGHT BREAST LIMITED TECHNIQUE: Right digital diagnostic mammography and breast tomosynthesis was performed.; Targeted ultrasound examination of the right breast was performed COMPARISON:  Previous exam(s). ACR Breast Density Category c: The breasts are heterogeneously dense, which may obscure small masses. FINDINGS: Diagnostic images of the RIGHT breast demonstrate decreased conspicuity of previously questioned asymmetry in the upper breast at posterior depth. No new suspicious findings are noted. Fibroglandular tissue has assumed a configuration stable in comparison to remote prior mammogram from 2016. No suspicious mass, distortion, or microcalcifications are identified to suggest presence of malignancy. Targeted ultrasound was performed of the upper breast. No suspicious cystic or solid mass is seen at 12 o'clock 10 cm from the nipple. Previously described possible complicated cyst is no longer visualized. During examination, patient reports a  palpable area in the far inner RIGHT breast. There is a firm ridge appreciated at the site of palpable concern. Targeted ultrasound was performed; a prominent costochondral junction is noted in this area. No suspicious cystic or solid mass is seen. IMPRESSION: 1. Interval resolution of previously described RIGHT breast mass consistent with  a benign etiology such as a cyst. No mammographic evidence of malignancy in the RIGHT breast. 2. No sonographic evidence of malignancy at the site of palpable concern in the far inner RIGHT breast. Any further workup of the patient's symptoms should be based on the clinical assessment. RECOMMENDATION: Recommend return to annual screening mammogram, due September 2024. I have discussed the findings and recommendations with the patient. If applicable, a reminder letter will be sent to the patient regarding the next appointment. BI-RADS CATEGORY  2: Benign. Electronically Signed   By: Meda Klinefelter M.D.   On: 05/09/2022 15:41  [place] LIMITED ULTRASOUND INCLUDING AXILLA RIGHT BREAST Result Date: 05/09/2022 CLINICAL DATA:  BI-RADS 3 follow-up of a possible complicated cyst noted at 12 o'clock 10 cm from nipple. EXAM: DIGITAL DIAGNOSTIC UNILATERAL RIGHT MAMMOGRAM WITH TOMOSYNTHESIS; ULTRASOUND RIGHT BREAST LIMITED TECHNIQUE: Right digital diagnostic mammography and breast tomosynthesis was performed.; Targeted ultrasound examination of the right breast was performed COMPARISON:  Previous exam(s). ACR Breast Density Category c: The breasts are heterogeneously dense, which may obscure small masses. FINDINGS: Diagnostic images of the RIGHT breast demonstrate decreased conspicuity of previously questioned asymmetry in the upper breast at posterior depth. No new suspicious findings are noted. Fibroglandular tissue has assumed a configuration stable in comparison to remote prior mammogram from 2016. No suspicious mass, distortion, or microcalcifications are identified to suggest presence of malignancy. Targeted ultrasound was performed of the upper breast. No suspicious cystic or solid mass is seen at 12 o'clock 10 cm from the nipple. Previously described possible complicated cyst is no longer visualized. During examination, patient reports a palpable area in the far inner RIGHT breast. There is a firm ridge appreciated at  the site of palpable concern. Targeted ultrasound was performed; a prominent costochondral junction is noted in this area. No suspicious cystic or solid mass is seen. IMPRESSION: 1. Interval resolution of previously described RIGHT breast mass consistent with a benign etiology such as a cyst. No mammographic evidence of malignancy in the RIGHT breast. 2. No sonographic evidence of malignancy at the site of palpable concern in the far inner RIGHT breast. Any further workup of the patient's symptoms should be based on the clinical assessment. RECOMMENDATION: Recommend return to annual screening mammogram, due September 2024. I have discussed the findings and recommendations with the patient. If applicable, a reminder letter will be sent to the patient regarding the next appointment. BI-RADS CATEGORY  2: Benign. Electronically Signed   By: Meda Klinefelter M.D.   On: 05/09/2022 15:41      Assessment & Plan:  Encounter for screening mammogram for malignant neoplasm of breast -     3D Screening Mammogram, Left and Right; Future  Need for influenza vaccination -     Flu vaccine trivalent PF, 6mos and older(Flulaval,Afluria,Fluarix,Fluzone)  Rheumatoid arthritis, involving unspecified site, unspecified whether rheumatoid factor present [facility]) Assessment & Plan: Previously saw Dr Gavin Potters. Issues with thumb pain as outlined. Seeing Dr Amanda Pea. Follow.   Orders: -     CBC with Differential/Platelet -     Basic metabolic panel -     Hepatic function panel -     Lipid panel -  TSH  Anemia, unspecified type Assessment & Plan: Documented history of anemia. Check cbc, iron studies and B12.   Orders: -     Vitamin B12 -     IBC + Ferritin  Thumb pain, unspecified laterality Assessment & Plan: Seeing Dr Amanda Pea for [facility] arthritis (thumb). S/p left thumb surgery. Persistent right thumb pain. Injection prn.   Colon cancer screening Assessment & Plan: Overdue colonoscopy. Refer to GI for screening  colonoscopy.   Orders: -     Ambulatory referral to Gastroenterology  Abnormal mammogram Assessment & Plan: F/u diagnostic mammogram and ultrasound - Birads II. Recommended screening mammogram one year.  Due 05/2023.    Encounter for screening for coronary artery disease Assessment & Plan: Discussed screening CT calcium score.  Will notify me if desires to be scheduled.       Dale Dongola, MD

## 2023-02-28 LAB — BASIC METABOLIC PANEL
BUN: 16 mg/dL (ref 6–23)
CO2: 29 meq/L (ref 19–32)
Calcium: 9.1 mg/dL (ref 8.4–10.5)
Chloride: 102 meq/L (ref 96–112)
Creatinine, Ser: 0.72 mg/dL (ref 0.40–1.20)
GFR: 96.81 mL/min (ref >60.00)
Glucose, Bld: 85 mg/dL (ref 70–99)
Potassium: 4.1 meq/L (ref 3.5–5.1)
Sodium: 139 meq/L (ref 135–145)

## 2023-02-28 LAB — LIPID PANEL
Cholesterol: 210 mg/dL — ABNORMAL HIGH (ref 0–200)
HDL: 52.8 mg/dL (ref >39.00)
LDL Cholesterol: 85 mg/dL (ref 0–99)
NonHDL: 156.89
Total CHOL/HDL Ratio: 4
Triglycerides: 360 mg/dL — ABNORMAL HIGH (ref 0.0–149.0)
VLDL: 72 mg/dL — ABNORMAL HIGH (ref 0.0–40.0)

## 2023-02-28 LAB — CBC WITH DIFFERENTIAL/PLATELET
Basophils Absolute: 0.1 10*3/uL (ref 0.0–0.1)
Basophils Relative: 1 % (ref 0.0–3.0)
Eosinophils Absolute: 0.1 10*3/uL (ref 0.0–0.7)
Eosinophils Relative: 1.5 % (ref 0.0–5.0)
HCT: 42.1 % (ref 36.0–46.0)
Hemoglobin: 13.9 g/dL (ref 12.0–15.0)
Lymphocytes Relative: 30.4 % (ref 12.0–46.0)
Lymphs Abs: 3.1 10*3/uL (ref 0.7–4.0)
MCHC: 33.1 g/dL (ref 30.0–36.0)
MCV: 90.5 fL (ref 78.0–100.0)
Monocytes Absolute: 0.8 10*3/uL (ref 0.1–1.0)
Monocytes Relative: 8.3 % (ref 3.0–12.0)
Neutro Abs: 6 10*3/uL (ref 1.4–7.7)
Neutrophils Relative %: 58.8 % (ref 43.0–77.0)
Platelets: 275 10*3/uL (ref 150.0–400.0)
RBC: 4.65 Mil/uL (ref 3.87–5.11)
RDW: 13.9 % (ref 11.5–15.5)
WBC: 10.1 10*3/uL (ref 4.0–10.5)

## 2023-02-28 LAB — HEPATIC FUNCTION PANEL
ALT: 14 U/L (ref 0–35)
AST: 14 U/L (ref 0–37)
Albumin: 4.2 g/dL (ref 3.5–5.2)
Alkaline Phosphatase: 60 U/L (ref 39–117)
Bilirubin, Direct: 0.1 mg/dL (ref 0.0–0.3)
Total Bilirubin: 0.2 mg/dL (ref 0.2–1.2)
Total Protein: 7 g/dL (ref 6.0–8.3)

## 2023-02-28 LAB — IBC + FERRITIN
Ferritin: 8.9 ng/mL — ABNORMAL LOW (ref 10.0–291.0)
Iron: 48 ug/dL (ref 42–145)
Saturation Ratios: 13.7 % — ABNORMAL LOW (ref 20.0–50.0)
TIBC: 350 ug/dL (ref 250.0–450.0)
Transferrin: 250 mg/dL (ref 212.0–360.0)

## 2023-02-28 LAB — TSH: TSH: 1.16 u[IU]/mL (ref 0.35–5.50)

## 2023-02-28 LAB — VITAMIN B12: Vitamin B-12: 977 pg/mL — ABNORMAL HIGH (ref 211–911)

## 2023-03-02 ENCOUNTER — Encounter: Payer: Self-pay | Admitting: Internal Medicine

## 2023-03-02 DIAGNOSIS — M79646 Pain in unspecified finger(s): Secondary | ICD-10-CM | POA: Insufficient documentation

## 2023-03-02 DIAGNOSIS — Z136 Encounter for screening for cardiovascular disorders: Secondary | ICD-10-CM | POA: Insufficient documentation

## 2023-03-02 DIAGNOSIS — R928 Other abnormal and inconclusive findings on diagnostic imaging of breast: Secondary | ICD-10-CM | POA: Insufficient documentation

## 2023-03-02 DIAGNOSIS — Z1211 Encounter for screening for malignant neoplasm of colon: Secondary | ICD-10-CM | POA: Insufficient documentation

## 2023-03-02 NOTE — Assessment & Plan Note (Signed)
Seeing Dr Amanda Pea for [facility] arthritis (thumb). S/p left thumb surgery. Persistent right thumb pain. Injection prn.

## 2023-03-02 NOTE — Assessment & Plan Note (Signed)
Previously saw Dr Gavin Potters. Issues with thumb pain as outlined. Seeing Dr Amanda Pea. Follow.

## 2023-03-02 NOTE — Assessment & Plan Note (Signed)
Discussed screening CT calcium score.  Will notify me if desires to be scheduled.

## 2023-03-02 NOTE — Assessment & Plan Note (Signed)
Documented history of anemia. Check cbc, iron studies and B12.

## 2023-03-02 NOTE — Assessment & Plan Note (Signed)
Overdue colonoscopy. Refer to GI for screening colonoscopy.

## 2023-03-02 NOTE — Assessment & Plan Note (Signed)
F/u diagnostic mammogram and ultrasound - Birads II. Recommended screening mammogram one year.  Due 05/2023.

## 2023-03-03 ENCOUNTER — Telehealth: Payer: Self-pay

## 2023-03-03 ENCOUNTER — Other Ambulatory Visit: Payer: Self-pay

## 2023-03-03 DIAGNOSIS — Z1211 Encounter for screening for malignant neoplasm of colon: Secondary | ICD-10-CM

## 2023-03-03 NOTE — Telephone Encounter (Signed)
Gastroenterology Pre-Procedure Review  Request Date: 04/30/23 Requesting Physician: Dr. Tobi Bastos  PATIENT REVIEW QUESTIONS: The patient responded to the following health history questions as indicated:    1. Are you having any GI issues? no 2. Do you have a personal history of Polyps? no 3. Do you have a family history of Colon Cancer or Polyps? no 4. Diabetes Mellitus? no 5. Joint replacements in the past 12 months?no 6. Major health problems in the past 3 months?no 7. Any artificial heart valves, MVP, or defibrillator?no    MEDICATIONS & ALLERGIES:    Patient reports the following regarding taking any anticoagulation/antiplatelet therapy:   Plavix, Coumadin, Eliquis, Xarelto, Lovenox, Pradaxa, Brilinta, or Effient? no Aspirin? no  Patient confirms/reports the following medications:  Current Outpatient Medications  Medication Sig Dispense Refill   fexofenadine (ALLEGRA) 180 MG tablet Take 180 mg by mouth daily.     ibuprofen (ADVIL) 200 MG tablet Take 800 mg by mouth every 8 (eight) hours as needed (pain.).     Magnesium 500 MG TABS Take 500 mg by mouth daily.     Multiple Vitamin (MULTIVITAMIN WITH MINERALS) TABS tablet Take 1 tablet by mouth daily.     naproxen sodium (ALEVE) 220 MG tablet Take 440 mg by mouth 2 (two) times daily as needed (pain.).     TURMERIC PO Take 1 tablet by mouth daily.     vitamin B-12 (CYANOCOBALAMIN) 100 MCG tablet Take 100 mcg by mouth daily.     Wheat Dextrin (BENEFIBER DRINK MIX) PACK Take 1 Dose by mouth daily.     No current facility-administered medications for this visit.    Patient confirms/reports the following allergies:  No Known Allergies  No orders of the defined types were placed in this encounter.   AUTHORIZATION INFORMATION Primary Insurance: 1D#: Group #:  Secondary Insurance: 1D#: Group #:  SCHEDULE INFORMATION: Date: 04/30/23 Time: Location: [facility]

## 2023-04-30 ENCOUNTER — Ambulatory Visit: Admit: 2023-04-30 | Payer: 59 | Admitting: Gastroenterology

## 2023-04-30 SURGERY — COLONOSCOPY WITH PROPOFOL
Anesthesia: General

## 2023-05-29 ENCOUNTER — Encounter: Payer: 59 | Admitting: Internal Medicine

## 2023-05-30 ENCOUNTER — Encounter: Payer: Self-pay | Admitting: Internal Medicine

## 2023-05-30 ENCOUNTER — Other Ambulatory Visit (HOSPITAL_COMMUNITY)
Admission: RE | Admit: 2023-05-30 | Discharge: 2023-05-30 | Disposition: A | Discharge status: Home / Self Care | Source: Ambulatory Visit | Attending: Internal Medicine | Admitting: Internal Medicine

## 2023-05-30 ENCOUNTER — Ambulatory Visit (INDEPENDENT_AMBULATORY_CARE_PROVIDER_SITE_OTHER): Admitting: Internal Medicine

## 2023-05-30 VITALS — BP 112/68 | HR 78 | Temp 98.2°F | Resp 16 | Ht 69.5 in | Wt 219.8 lb

## 2023-05-30 DIAGNOSIS — D649 Anemia, unspecified: Secondary | ICD-10-CM | POA: Diagnosis not present

## 2023-05-30 DIAGNOSIS — Z0000 Encounter for general adult medical examination without abnormal findings: Secondary | ICD-10-CM | POA: Diagnosis not present

## 2023-05-30 DIAGNOSIS — Z124 Encounter for screening for malignant neoplasm of cervix: Secondary | ICD-10-CM

## 2023-05-30 DIAGNOSIS — M069 Rheumatoid arthritis, unspecified: Secondary | ICD-10-CM | POA: Diagnosis not present

## 2023-05-30 DIAGNOSIS — R928 Other abnormal and inconclusive findings on diagnostic imaging of breast: Secondary | ICD-10-CM

## 2023-05-30 DIAGNOSIS — F439 Reaction to severe stress, unspecified: Secondary | ICD-10-CM

## 2023-05-30 LAB — FERRITIN: Ferritin: 23.4 ng/mL (ref 10.0–291.0)

## 2023-05-30 LAB — LIPID PANEL
Cholesterol: 189 mg/dL (ref 0–200)
HDL: 53.2 mg/dL (ref >39.00)
LDL Cholesterol: 103 mg/dL — ABNORMAL HIGH (ref 0–99)
NonHDL: 136.19
Total CHOL/HDL Ratio: 4
Triglycerides: 165 mg/dL — ABNORMAL HIGH (ref 0.0–149.0)
VLDL: 33 mg/dL (ref 0.0–40.0)

## 2023-05-30 LAB — HEPATIC FUNCTION PANEL
ALT: 23 U/L (ref 0–35)
AST: 18 U/L (ref 0–37)
Albumin: 4.2 g/dL (ref 3.5–5.2)
Alkaline Phosphatase: 52 U/L (ref 39–117)
Bilirubin, Direct: 0.1 mg/dL (ref 0.0–0.3)
Total Bilirubin: 0.4 mg/dL (ref 0.2–1.2)
Total Protein: 6.6 g/dL (ref 6.0–8.3)

## 2023-05-30 LAB — CBC WITH DIFFERENTIAL/PLATELET
Basophils Absolute: 0 10*3/uL (ref 0.0–0.1)
Basophils Relative: 0.8 % (ref 0.0–3.0)
Eosinophils Absolute: 0.2 10*3/uL (ref 0.0–0.7)
Eosinophils Relative: 2.8 % (ref 0.0–5.0)
HCT: 43 % (ref 36.0–46.0)
Hemoglobin: 14.4 g/dL (ref 12.0–15.0)
Lymphocytes Relative: 35.6 % (ref 12.0–46.0)
Lymphs Abs: 2.1 10*3/uL (ref 0.7–4.0)
MCHC: 33.4 g/dL (ref 30.0–36.0)
MCV: 90.6 fl (ref 78.0–100.0)
Monocytes Absolute: 0.5 10*3/uL (ref 0.1–1.0)
Monocytes Relative: 7.8 % (ref 3.0–12.0)
Neutro Abs: 3.1 10*3/uL (ref 1.4–7.7)
Neutrophils Relative %: 53 % (ref 43.0–77.0)
Platelets: 259 10*3/uL (ref 150.0–400.0)
RBC: 4.74 Mil/uL (ref 3.87–5.11)
RDW: 14.8 % (ref 11.5–15.5)
WBC: 5.8 10*3/uL (ref 4.0–10.5)

## 2023-05-30 LAB — BASIC METABOLIC PANEL WITH GFR
BUN: 12 mg/dL (ref 6–23)
CO2: 30 meq/L (ref 19–32)
Calcium: 9.3 mg/dL (ref 8.4–10.5)
Chloride: 104 meq/L (ref 96–112)
Creatinine, Ser: 0.77 mg/dL (ref 0.40–1.20)
GFR: 89.16 mL/min (ref >60.00)
Glucose, Bld: 91 mg/dL (ref 70–99)
Potassium: 4 meq/L (ref 3.5–5.1)
Sodium: 141 meq/L (ref 135–145)

## 2023-05-30 MED ORDER — VENLAFAXINE HCL ER 37.5 MG PO CP24: 37.5000 mg | ORAL_CAPSULE | Freq: Every day | ORAL | 2 refills | Status: DC

## 2023-05-30 NOTE — Progress Notes (Addendum)
 Subjective:    Patient ID: Kerri Sullivan, female    DOB: 01-25-1971, 53 y.o.   MRN: [id number]  Patient here for  Chief Complaint  Patient presents with   Annual Exam    HPI Here for a physical exam. Established care with me 02/27/23. Was seeing Dr Charlies Contes. Also seeing Dr Aloha Arnold for [facility] arthritis (thumb). S/p left thumb surgery. Persistent right thumb pain. Injection prn. In reviewing, she has a documented history of RA. Previously saw Dr Ivette Marks. Recent labs - iron deficiency. Was started on ferrous sulfate. Referred to GI. Had colonoscopy scheduled - had to be rescheduled. Plans to do this summer. Increased stress - mother's health issues. Primary caretaker. Mother has moved in with her. Feels needs something to help level things out. Also having hot flashes. Sleep is affected. Discussed mammogram. Due.    Past Medical History:  Diagnosis Date   Arthritis    rheumatoid arthritis-OFF METHOTREXATE SINCE 2016 AND DOING WELL PER PT (09-13-15)   Asthma    high school   Family history of adverse reaction to anesthesia    MOM-N/V   History of abnormal cervical Pap smear    Past Surgical History:  Procedure Laterality Date   APPENDECTOMY  June 2021   CHOLECYSTECTOMY  1998   FOOT SURGERY Left    repair of tendon   IR RADIOLOGIST EVAL & MGMT  07/14/2019   appenditis   JOINT REPLACEMENT  February 2020   Left Thumb   KNEE SURGERY  1987   right x2 and left x 1-arthroscopy   LIPOMA EXCISION N/A 09/21/2015   Procedure: EXCISION LIPOMA BACK OF NECK;  Surgeon: Jerlean Mood, MD;  Location: [facility] ORS;  Service: General;  Laterality: N/A;   XI ROBOTIC LAPAROSCOPIC ASSISTED APPENDECTOMY N/A 08/02/2019   Procedure: XI ROBOTIC LAPAROSCOPIC ASSISTED APPENDECTOMY;  Surgeon: Eldred Grego, MD;  Location: [facility] ORS;  Service: General;  Laterality: N/A;   Family History  Problem Relation Age of Onset   Osteoporosis Mother    Arthritis Mother    Stroke Mother    Heart disease Father     Hypertension Father    Arthritis Father    COPD Father    Diabetes Brother    Alcoholism Brother    Alcohol abuse Brother    Early death Brother    Suicidality Brother    Early death Brother    Diabetes Maternal Uncle    Cancer Paternal Aunt 8       breast   Diabetes Maternal Grandmother    Stroke Maternal Grandmother    Stroke Paternal Grandmother    Stroke Paternal Grandfather    Cancer Cousin        colon   Lung cancer Cousin        died late 48s   Breast cancer Neg Hx    Social History   Socioeconomic History   Marital status: Divorced    Spouse name: Not on file   Number of children: 1   Years of education: 16   Highest education level: Master's degree (e.g., MA, MS, MEng, MEd, MSW, MBA)  Occupational History   Occupation: Runner, broadcasting/film/video  Tobacco Use   Smoking status: Never   Smokeless tobacco: Never   Tobacco comments:    Quit in 1997  Vaping Use   Vaping status: Never Used  Substance and Sexual Activity   Alcohol use: Yes    Alcohol/week: 1.0 standard drink of alcohol    Types: 1 Shots of liquor  per week    Comment: RARE   Drug use: No   Sexual activity: Not Currently    Birth control/protection: None  Other Topics Concern   Not on file  Social History Narrative   Not on file   Social Drivers of Health   Financial Resource Strain: Low Risk  (02/27/2023)   Overall Financial Resource Strain (CARDIA)    Difficulty of Paying Living Expenses: Not very hard  Food Insecurity: No Food Insecurity (02/27/2023)   Hunger Vital Sign    Worried About Running Out of Food in the Last Year: Never true    Ran Out of Food in the Last Year: Never true  Transportation Needs: No Transportation Needs (02/27/2023)   PRAPARE - Administrator, Civil Service (Medical): No    Lack of Transportation (Non-Medical): No  Physical Activity: Insufficiently Active (02/27/2023)   Exercise Vital Sign    Days of Exercise per Week: 2 days    Minutes of Exercise per Session: 20  min  Stress: Stress Concern Present (02/27/2023)   [employer] of Occupational Health - Occupational Stress Questionnaire    Feeling of Stress : To some extent  Social Connections: Moderately Integrated (02/27/2023)   Social Connection and Isolation Panel [NHANES]    Frequency of Communication with Friends and Family: More than three times a week    Frequency of Social Gatherings with Friends and Family: Three times a week    Attends Religious Services: 1 to 4 times per year    Active Member of Clubs or Organizations: Yes    Attends Engineer, structural: More than 4 times per year    Marital Status: Divorced     Review of Systems  Constitutional:  Negative for appetite change and unexpected weight change.  HENT:  Negative for congestion, sinus pressure and sore throat.   Eyes:  Negative for pain and visual disturbance.  Respiratory:  Negative for cough, chest tightness and shortness of breath.   Cardiovascular:  Negative for chest pain, palpitations and leg swelling.  Gastrointestinal:  Negative for abdominal pain, diarrhea, nausea and vomiting.  Genitourinary:  Negative for difficulty urinating and dysuria.  Musculoskeletal:  Negative for joint swelling and myalgias.  Skin:  Negative for color change and rash.  Neurological:  Negative for dizziness and headaches.  Hematological:  Negative for adenopathy. Does not bruise/bleed easily.  Psychiatric/Behavioral:  Negative for agitation and dysphoric mood.        Objective:     BP 112/68   Pulse 78   Temp 98.2 F (36.8 C)   Resp 16   Ht 5' 9.5" (1.765 m)   Wt 219 lb 12.8 oz (99.7 kg)   SpO2 97%   BMI 31.99 kg/m  Wt Readings from Last 3 Encounters:  05/30/23 219 lb 12.8 oz (99.7 kg)  02/27/23 224 lb 2 oz (101.7 kg)  07/27/19 204 lb (92.5 kg)    Physical Exam Vitals reviewed.  Constitutional:      General: She is not in acute distress.    Appearance: Normal appearance. She is well-developed.  HENT:      Head: Normocephalic and atraumatic.     Right Ear: External ear normal.     Left Ear: External ear normal.     Mouth/Throat:     Pharynx: No oropharyngeal exudate or posterior oropharyngeal erythema.  Eyes:     General: No scleral icterus.       Right eye: No discharge.  Left eye: No discharge.     Conjunctiva/sclera: Conjunctivae normal.  Neck:     Thyroid : No thyromegaly.  Cardiovascular:     Rate and Rhythm: Normal rate and regular rhythm.  Pulmonary:     Effort: No tachypnea, accessory muscle usage or respiratory distress.     Breath sounds: Normal breath sounds. No decreased breath sounds or wheezing.  Chest:  Breasts:    Right: No inverted nipple, mass, nipple discharge or tenderness (no axillary adenopathy).     Left: No inverted nipple, mass, nipple discharge or tenderness (no axilarry adenopathy).  Abdominal:     General: Bowel sounds are normal.     Palpations: Abdomen is soft.     Tenderness: There is no abdominal tenderness.  Musculoskeletal:        General: No swelling or tenderness.     Cervical back: Neck supple.  Lymphadenopathy:     Cervical: No cervical adenopathy.  Skin:    Findings: No erythema or rash.  Neurological:     Mental Status: She is alert and oriented to person, place, and time.  Psychiatric:        Mood and Affect: Mood normal.        Behavior: Behavior normal.         Outpatient Encounter Medications as of 05/30/2023  Medication Sig   venlafaxine XR (EFFEXOR XR) 37.5 MG 24 hr capsule Take 1 capsule (37.5 mg total) by mouth daily with breakfast.   fexofenadine (ALLEGRA) 180 MG tablet Take 180 mg by mouth daily.   ibuprofen (ADVIL) 200 MG tablet Take 800 mg by mouth every 8 (eight) hours as needed (pain.).   Magnesium 500 MG TABS Take 500 mg by mouth daily.   Multiple Vitamin (MULTIVITAMIN WITH MINERALS) TABS tablet Take 1 tablet by mouth daily.   naproxen sodium (ALEVE) 220 MG tablet Take 440 mg by mouth 2 (two) times daily as  needed (pain.).   TURMERIC PO Take 1 tablet by mouth daily.   vitamin B-12 (CYANOCOBALAMIN ) 100 MCG tablet Take 100 mcg by mouth daily.   Wheat Dextrin (BENEFIBER DRINK MIX) PACK Take 1 Dose by mouth daily.   No facility-administered encounter medications on file as of 05/30/2023.     Lab Results  Component Value Date   WBC 5.8 05/30/2023   HGB 14.4 05/30/2023   HCT 43.0 05/30/2023   PLT 259.0 05/30/2023   GLUCOSE 91 05/30/2023   CHOL 189 05/30/2023   TRIG 165.0 (H) 05/30/2023   HDL 53.20 05/30/2023   LDLCALC 103 (H) 05/30/2023   ALT 23 05/30/2023   AST 18 05/30/2023   NA 141 05/30/2023   K 4.0 05/30/2023   CL 104 05/30/2023   CREATININE 0.77 05/30/2023   BUN 12 05/30/2023   CO2 30 05/30/2023   TSH 1.16 02/27/2023    MM 3D DIAGNOSTIC MAMMOGRAM UNILATERAL RIGHT BREAST Result Date: 05/09/2022 CLINICAL DATA:  BI-RADS 3 follow-up of a possible complicated cyst noted at 12 o'clock 10 cm from nipple. EXAM: DIGITAL DIAGNOSTIC UNILATERAL RIGHT MAMMOGRAM WITH TOMOSYNTHESIS; ULTRASOUND RIGHT BREAST LIMITED TECHNIQUE: Right digital diagnostic mammography and breast tomosynthesis was performed.; Targeted ultrasound examination of the right breast was performed COMPARISON:  Previous exam(s). ACR Breast Density Category c: The breasts are heterogeneously dense, which may obscure small masses. FINDINGS: Diagnostic images of the RIGHT breast demonstrate decreased conspicuity of previously questioned asymmetry in the upper breast at posterior depth. No new suspicious findings are noted. Fibroglandular tissue has assumed a configuration stable in comparison  to remote prior mammogram from 2016. No suspicious mass, distortion, or microcalcifications are identified to suggest presence of malignancy. Targeted ultrasound was performed of the upper breast. No suspicious cystic or solid mass is seen at 12 o'clock 10 cm from the nipple. Previously described possible complicated cyst is no longer visualized.  During examination, patient reports a palpable area in the far inner RIGHT breast. There is a firm ridge appreciated at the site of palpable concern. Targeted ultrasound was performed; a prominent costochondral junction is noted in this area. No suspicious cystic or solid mass is seen. IMPRESSION: 1. Interval resolution of previously described RIGHT breast mass consistent with a benign etiology such as a cyst. No mammographic evidence of malignancy in the RIGHT breast. 2. No sonographic evidence of malignancy at the site of palpable concern in the far inner RIGHT breast. Any further workup of the patient's symptoms should be based on the clinical assessment. RECOMMENDATION: Recommend return to annual screening mammogram, due September 2024. I have discussed the findings and recommendations with the patient. If applicable, a reminder letter will be sent to the patient regarding the next appointment. BI-RADS CATEGORY  2: Benign. Electronically Signed   By: Clancy Crimes M.D.   On: 05/09/2022 15:41  US  LIMITED ULTRASOUND INCLUDING AXILLA RIGHT BREAST Result Date: 05/09/2022 CLINICAL DATA:  BI-RADS 3 follow-up of a possible complicated cyst noted at 12 o'clock 10 cm from nipple. EXAM: DIGITAL DIAGNOSTIC UNILATERAL RIGHT MAMMOGRAM WITH TOMOSYNTHESIS; ULTRASOUND RIGHT BREAST LIMITED TECHNIQUE: Right digital diagnostic mammography and breast tomosynthesis was performed.; Targeted ultrasound examination of the right breast was performed COMPARISON:  Previous exam(s). ACR Breast Density Category c: The breasts are heterogeneously dense, which may obscure small masses. FINDINGS: Diagnostic images of the RIGHT breast demonstrate decreased conspicuity of previously questioned asymmetry in the upper breast at posterior depth. No new suspicious findings are noted. Fibroglandular tissue has assumed a configuration stable in comparison to remote prior mammogram from 2016. No suspicious mass, distortion, or  microcalcifications are identified to suggest presence of malignancy. Targeted ultrasound was performed of the upper breast. No suspicious cystic or solid mass is seen at 12 o'clock 10 cm from the nipple. Previously described possible complicated cyst is no longer visualized. During examination, patient reports a palpable area in the far inner RIGHT breast. There is a firm ridge appreciated at the site of palpable concern. Targeted ultrasound was performed; a prominent costochondral junction is noted in this area. No suspicious cystic or solid mass is seen. IMPRESSION: 1. Interval resolution of previously described RIGHT breast mass consistent with a benign etiology such as a cyst. No mammographic evidence of malignancy in the RIGHT breast. 2. No sonographic evidence of malignancy at the site of palpable concern in the far inner RIGHT breast. Any further workup of the patient's symptoms should be based on the clinical assessment. RECOMMENDATION: Recommend return to annual screening mammogram, due September 2024. I have discussed the findings and recommendations with the patient. If applicable, a reminder letter will be sent to the patient regarding the next appointment. BI-RADS CATEGORY  2: Benign. Electronically Signed   By: Clancy Crimes M.D.   On: 05/09/2022 15:41      Assessment & Plan:  Routine general medical examination at a health care facility  Rheumatoid arthritis, involving unspecified site, unspecified whether rheumatoid factor present [facility]) Assessment & Plan: Previously saw Dr Ivette Marks. Issues with thumb pain as outlined. Seeing Dr Aloha Arnold. Follow.   Orders: -     Lipid panel -  Hepatic function panel -     Basic metabolic panel with GFR  Anemia, unspecified type Assessment & Plan: Documented history of anemia.  Follow cbc.   Orders: -     CBC with Differential/Platelet -     Ferritin  Health care maintenance Assessment & Plan: Physical today 05/30/23. PAP 05/30/23.  Overdue mammogram. Need to schedule. Referred to GI for colonoscopy. Colonoscopy had to be rescheduled. Planning to f/u with colonoscopy this summer.    Screening for cervical cancer -     Cytology - PAP  Abnormal mammogram Assessment & Plan: F/u diagnostic mammogram and ultrasound - Birads II. Recommended screening mammogram one year.  Due 05/2023. Scheduled for 06/11/23.    Stress Assessment & Plan: Increased stress discussed.  Also with hot flashes and sleep issues. Discussed treatment options. Will start effexor - low dose. Call with update. Titrate as tolerated.    Other orders -     Venlafaxine HCl ER; Take 1 capsule (37.5 mg total) by mouth daily with breakfast.  Dispense: 30 capsule; Refill: 2     Dellar Fenton, MD

## 2023-05-30 NOTE — Assessment & Plan Note (Addendum)
 Physical today 05/30/23. PAP 05/30/23. Overdue mammogram. Need to schedule. Referred to GI for colonoscopy. Colonoscopy had to be rescheduled. Planning to f/u with colonoscopy this summer.

## 2023-06-01 ENCOUNTER — Encounter: Payer: Self-pay | Admitting: Internal Medicine

## 2023-06-01 DIAGNOSIS — F439 Reaction to severe stress, unspecified: Secondary | ICD-10-CM | POA: Insufficient documentation

## 2023-06-01 NOTE — Assessment & Plan Note (Signed)
Documented history of anemia.  Follow cbc.

## 2023-06-01 NOTE — Assessment & Plan Note (Signed)
 F/u diagnostic mammogram and ultrasound - Birads II. Recommended screening mammogram one year.  Due 05/2023. Scheduled for 06/11/23.

## 2023-06-01 NOTE — Assessment & Plan Note (Signed)
 Increased stress discussed.  Also with hot flashes and sleep issues. Discussed treatment options. Will start effexor - low dose. Call with update. Titrate as tolerated.

## 2023-06-01 NOTE — Assessment & Plan Note (Signed)
 Previously saw Dr Gavin Potters. Issues with thumb pain as outlined. Seeing Dr Amanda Pea. Follow.

## 2023-06-03 LAB — CYTOLOGY - PAP
Comment: NEGATIVE
Diagnosis: NEGATIVE
High risk HPV: NEGATIVE

## 2023-06-11 ENCOUNTER — Ambulatory Visit
Admission: RE | Admit: 2023-06-11 | Discharge: 2023-06-11 | Disposition: A | Discharge status: Home / Self Care | Source: Ambulatory Visit | Attending: Internal Medicine | Admitting: Internal Medicine

## 2023-06-11 DIAGNOSIS — Z1231 Encounter for screening mammogram for malignant neoplasm of breast: Secondary | ICD-10-CM | POA: Diagnosis present

## 2023-06-13 ENCOUNTER — Other Ambulatory Visit: Payer: Self-pay | Admitting: Internal Medicine

## 2023-06-13 DIAGNOSIS — R928 Other abnormal and inconclusive findings on diagnostic imaging of breast: Secondary | ICD-10-CM

## 2023-06-17 ENCOUNTER — Encounter

## 2023-06-17 ENCOUNTER — Other Ambulatory Visit

## 2023-06-26 ENCOUNTER — Encounter: Payer: Self-pay | Admitting: Internal Medicine

## 2023-07-01 ENCOUNTER — Other Ambulatory Visit: Payer: Self-pay

## 2023-07-01 MED ORDER — VENLAFAXINE HCL ER 75 MG PO CP24: 75.0000 mg | ORAL_CAPSULE | Freq: Every day | ORAL | 2 refills | Status: DC

## 2023-07-01 NOTE — Telephone Encounter (Signed)
 I am ok to increase effexor  to 75mg  q day.  Will need new rx. Make sure has f/u scheduled.

## 2023-07-01 NOTE — Telephone Encounter (Signed)
 She is on venlafaxine  37.5 mg q day. Tolerating well. Asking to increase. Are you ok with increasing? She has been on this dose for about a month.

## 2023-07-03 ENCOUNTER — Ambulatory Visit
Admission: RE | Admit: 2023-07-03 | Discharge: 2023-07-03 | Disposition: A | Discharge status: Home / Self Care | Source: Ambulatory Visit | Attending: Internal Medicine | Admitting: Internal Medicine

## 2023-07-03 DIAGNOSIS — R928 Other abnormal and inconclusive findings on diagnostic imaging of breast: Secondary | ICD-10-CM

## 2023-07-04 ENCOUNTER — Ambulatory Visit: Payer: Self-pay | Admitting: Internal Medicine

## 2023-07-16 ENCOUNTER — Encounter: Payer: Self-pay | Admitting: Internal Medicine

## 2023-07-16 ENCOUNTER — Telehealth: Payer: Self-pay

## 2023-07-16 ENCOUNTER — Other Ambulatory Visit: Payer: Self-pay

## 2023-07-16 DIAGNOSIS — Z1211 Encounter for screening for malignant neoplasm of colon: Secondary | ICD-10-CM

## 2023-07-16 MED ORDER — NA SULFATE-K SULFATE-MG SULF 17.5-3.13-1.6 GM/177ML PO SOLN: 1.0000 | Freq: Once | ORAL | 0 refills | Status: AC

## 2023-07-16 NOTE — Telephone Encounter (Signed)
 In reviewing the referral note, it appears she was referred to Dr Antony Baumgartner. He is with [facility] now. She can call [facility] and ask for GI dept. [facility] Number [phone]. Let us  know if any problems or if a new referral is needed

## 2023-07-16 NOTE — Telephone Encounter (Signed)
 Gastroenterology Pre-Procedure Review  Request Date: 08/21/23 Requesting Physician: Dr. Ole Berkeley  PATIENT REVIEW QUESTIONS: The patient responded to the following health history questions as indicated:    1. Are you having any GI issues? no 2. Do you have a personal history of Polyps? no 3. Do you have a family history of Colon Cancer or Polyps? no 4. Diabetes Mellitus? no 5. Joint replacements in the past 12 months?no 6. Major health problems in the past 3 months?no 7. Any artificial heart valves, MVP, or defibrillator?no    MEDICATIONS & ALLERGIES:    Patient reports the following regarding taking any anticoagulation/antiplatelet therapy:   Plavix, Coumadin, Eliquis, Xarelto, Lovenox , Pradaxa, Brilinta, or Effient? no Aspirin? no  Patient confirms/reports the following medications:  Current Outpatient Medications  Medication Sig Dispense Refill   fexofenadine (ALLEGRA) 180 MG tablet Take 180 mg by mouth daily.     ibuprofen (ADVIL) 200 MG tablet Take 800 mg by mouth every 8 (eight) hours as needed (pain.).     Magnesium 500 MG TABS Take 500 mg by mouth daily.     Multiple Vitamin (MULTIVITAMIN WITH MINERALS) TABS tablet Take 1 tablet by mouth daily.     naproxen sodium (ALEVE) 220 MG tablet Take 440 mg by mouth 2 (two) times daily as needed (pain.).     TURMERIC PO Take 1 tablet by mouth daily.     venlafaxine  XR (EFFEXOR  XR) 75 MG 24 hr capsule Take 1 capsule (75 mg total) by mouth daily with breakfast. 30 capsule 2   vitamin B-12 (CYANOCOBALAMIN ) 100 MCG tablet Take 100 mcg by mouth daily.     Wheat Dextrin (BENEFIBER DRINK MIX) PACK Take 1 Dose by mouth daily.     No current facility-administered medications for this visit.    Patient confirms/reports the following allergies:  No Known Allergies  No orders of the defined types were placed in this encounter.   AUTHORIZATION INFORMATION Primary Insurance: 1D#: Group #:  Secondary Insurance: 1D#: Group #:  [id number]  INFORMATION: Date: 08/21/23 Time: Location: [facility]

## 2023-07-30 ENCOUNTER — Telehealth: Admitting: Internal Medicine

## 2023-07-30 ENCOUNTER — Encounter: Payer: Self-pay | Admitting: Internal Medicine

## 2023-07-30 VITALS — Ht 69.5 in | Wt 213.0 lb

## 2023-07-30 DIAGNOSIS — F439 Reaction to severe stress, unspecified: Secondary | ICD-10-CM | POA: Diagnosis not present

## 2023-07-30 DIAGNOSIS — M79646 Pain in unspecified finger(s): Secondary | ICD-10-CM

## 2023-07-30 DIAGNOSIS — M069 Rheumatoid arthritis, unspecified: Secondary | ICD-10-CM | POA: Diagnosis not present

## 2023-07-30 DIAGNOSIS — Z1211 Encounter for screening for malignant neoplasm of colon: Secondary | ICD-10-CM

## 2023-07-30 NOTE — Progress Notes (Signed)
 Patient ID: Kerri Sullivan, female   DOB: 09-21-1970, 53 y.o.   MRN: [id number]   Virtual Visit via video Note  I connected with Loralye Wyss by a video enabled telemedicine application and verified that I am speaking with the correct person using two identifiers. Location patient: home Location provider: work  Persons participating in the virtual visit: patient, provider  The limitations, risks, security and privacy concerns of performing an evaluation and management service by video and the availability of in person appointments have been discussed. It has also been discussed with the patient that there may be a patient responsible charge related to this service. The patient expressed understanding and agreed to proceed.   Reason for visit: follow up appt  HPI: Follow up regarding IDA and increased stress. Started on effexor  last visit. Started at 37.5mg  daily dose. Increased to 75mg . Appears to be doing well on this dose. Still with increased stress, but reports appears medication is helping deal with the stress.  Hospice has just started coming out to help care for her mother. Discussed that hopefully this will help offset some of her stress. Saw Dr Camella 07/10/23 - right thumb [facility] arthritis s/p injection. Scheduled for colonoscopy 08/11/23.    ROS: See pertinent positives and negatives per HPI.  Past Medical History:  Diagnosis Date   Arthritis    rheumatoid arthritis-OFF METHOTREXATE SINCE 2016 AND DOING WELL PER PT (09-13-15)   Asthma    high school   Family history of adverse reaction to anesthesia    MOM-N/V   History of abnormal cervical Pap smear     Past Surgical History:  Procedure Laterality Date   APPENDECTOMY  June 2021   CHOLECYSTECTOMY  1998   FOOT SURGERY Left    repair of tendon   IR RADIOLOGIST EVAL & MGMT  07/14/2019   appenditis   JOINT REPLACEMENT  February 2020   Left Thumb   KNEE SURGERY  1987   right x2 and left x 1-arthroscopy   LIPOMA EXCISION N/A  09/21/2015   Procedure: EXCISION LIPOMA BACK OF NECK;  Surgeon: Louanne KANDICE Muse, MD;  Location: [facility] ORS;  Service: General;  Laterality: N/A;   XI ROBOTIC LAPAROSCOPIC ASSISTED APPENDECTOMY N/A 08/02/2019   Procedure: XI ROBOTIC LAPAROSCOPIC ASSISTED APPENDECTOMY;  Surgeon: Rodolph Romano, MD;  Location: [facility] ORS;  Service: General;  Laterality: N/A;    Family History  Problem Relation Age of Onset   Osteoporosis Mother    Arthritis Mother    Stroke Mother    Heart disease Father    Hypertension Father    Arthritis Father    COPD Father    Diabetes Brother    Alcoholism Brother    Alcohol abuse Brother    Early death Brother    Suicidality Brother    Early death Brother    Diabetes Maternal Uncle    Cancer Paternal Aunt 73       breast   Diabetes Maternal Grandmother    Stroke Maternal Grandmother    Stroke Paternal Grandmother    Stroke Paternal Grandfather    Cancer Cousin        colon   Lung cancer Cousin        died late 64s   Breast cancer Neg Hx     SOCIAL HX: reviewed.    Current Outpatient Medications:    fexofenadine (ALLEGRA) 180 MG tablet, Take 180 mg by mouth daily., Disp: , Rfl:    ibuprofen (ADVIL) 200 MG tablet,  Take 800 mg by mouth every 8 (eight) hours as needed (pain.)., Disp: , Rfl:    Magnesium 500 MG TABS, Take 500 mg by mouth daily., Disp: , Rfl:    Multiple Vitamin (MULTIVITAMIN WITH MINERALS) TABS tablet, Take 1 tablet by mouth daily., Disp: , Rfl:    naproxen sodium (ALEVE) 220 MG tablet, Take 440 mg by mouth 2 (two) times daily as needed (pain.)., Disp: , Rfl:    TURMERIC PO, Take 1 tablet by mouth daily., Disp: , Rfl:    venlafaxine  XR (EFFEXOR  XR) 75 MG 24 hr capsule, Take 1 capsule (75 mg total) by mouth daily with breakfast., Disp: 30 capsule, Rfl: 2   vitamin B-12 (CYANOCOBALAMIN ) 100 MCG tablet, Take 100 mcg by mouth daily., Disp: , Rfl:    Wheat Dextrin (BENEFIBER DRINK MIX) PACK, Take 1 Dose by mouth daily., Disp: , Rfl:    EXAM:  GENERAL: alert, oriented, appears well and in no acute distress  HEENT: atraumatic, conjunttiva clear, no obvious abnormalities on inspection of external nose and ears  NECK: normal movements of the head and neck  LUNGS: on inspection no signs of respiratory distress, breathing rate appears normal, no obvious gross SOB, gasping or wheezing  CV: no obvious cyanosis  PSYCH/NEURO: pleasant and cooperative, no obvious depression or anxiety, speech and thought processing grossly intact  ASSESSMENT AND PLAN:  Discussed the following assessment and plan:  Problem List Items Addressed This Visit     Thumb pain   Saw Dr Camella 07/10/23 - right thumb [facility] arthritis s/p injection.      Stress - Primary   Increaseds stress as outlined. Currently on effexor  75mg  q day now. Appears this is helping. Will continue current dose of effexor . Follow.  Call with update.       Rheumatoid arthritis [facility])   Previously saw Dr Maryl. Issues with thumb pain as outlined. Seeing Dr Camella. Follow.       Colon cancer screening   Scheduled for colonoscopy 08/11/23.        Return in about 3 months (around 10/30/2023) for follow-up.   I discussed the assessment and treatment plan with the patient. The patient was provided an opportunity to ask questions and all were answered. The patient agreed with the plan and demonstrated an understanding of the instructions.   The patient was advised to call back or seek an in-person evaluation if the symptoms worsen or if the condition fails to improve as anticipated.    Kerri Hamilton, MD

## 2023-07-31 ENCOUNTER — Telehealth: Admitting: Internal Medicine

## 2023-08-03 ENCOUNTER — Encounter: Payer: Self-pay | Admitting: Internal Medicine

## 2023-08-03 NOTE — Assessment & Plan Note (Signed)
 Saw Dr Camella 07/10/23 - right thumb [facility] arthritis s/p injection.

## 2023-08-03 NOTE — Assessment & Plan Note (Signed)
 Increaseds stress as outlined. Currently on effexor  75mg  q day now. Appears this is helping. Will continue current dose of effexor . Follow.  Call with update.

## 2023-08-03 NOTE — Assessment & Plan Note (Signed)
 Scheduled for colonoscopy 08/11/23.

## 2023-08-03 NOTE — Assessment & Plan Note (Signed)
 Previously saw Dr Gavin Potters. Issues with thumb pain as outlined. Seeing Dr Amanda Pea. Follow.

## 2023-08-21 ENCOUNTER — Encounter
Admission: RE | Disposition: A | Discharge status: Home / Self Care | Payer: Self-pay | Source: Home / Self Care | Attending: Gastroenterology

## 2023-08-21 ENCOUNTER — Ambulatory Visit
Admission: RE | Admit: 2023-08-21 | Discharge: 2023-08-21 | Disposition: A | Discharge status: Home / Self Care | Attending: Gastroenterology | Admitting: Gastroenterology

## 2023-08-21 ENCOUNTER — Ambulatory Visit: Admitting: Certified Registered"

## 2023-08-21 ENCOUNTER — Other Ambulatory Visit: Payer: Self-pay

## 2023-08-21 ENCOUNTER — Encounter: Payer: Self-pay | Admitting: Gastroenterology

## 2023-08-21 DIAGNOSIS — K641 Second degree hemorrhoids: Secondary | ICD-10-CM | POA: Diagnosis not present

## 2023-08-21 DIAGNOSIS — M069 Rheumatoid arthritis, unspecified: Secondary | ICD-10-CM | POA: Diagnosis not present

## 2023-08-21 DIAGNOSIS — Z1211 Encounter for screening for malignant neoplasm of colon: Secondary | ICD-10-CM | POA: Diagnosis present

## 2023-08-21 HISTORY — PX: COLONOSCOPY: SHX5424

## 2023-08-21 SURGERY — COLONOSCOPY
Anesthesia: General

## 2023-08-21 MED ORDER — LIDOCAINE HCL (CARDIAC) PF 100 MG/5ML IV SOSY
PREFILLED_SYRINGE | INTRAVENOUS | Status: DC | PRN
  Administered 2023-08-21: 50 mg via INTRAVENOUS

## 2023-08-21 MED ORDER — LIDOCAINE HCL (PF) 2 % IJ SOLN
INTRAMUSCULAR | Status: AC
  Filled 2023-08-21: qty 5

## 2023-08-21 MED ORDER — SODIUM CHLORIDE 0.9 % IV SOLN: INTRAVENOUS | Status: DC

## 2023-08-21 MED ORDER — PROPOFOL 1000 MG/100ML IV EMUL
INTRAVENOUS | Status: AC
  Filled 2023-08-21: qty 100

## 2023-08-21 MED ORDER — PROPOFOL 10 MG/ML IV BOLUS
INTRAVENOUS | Status: DC | PRN
  Administered 2023-08-21: 10 mg via INTRAVENOUS
  Administered 2023-08-21: 60 mg via INTRAVENOUS

## 2023-08-21 MED ORDER — PROPOFOL 500 MG/50ML IV EMUL
INTRAVENOUS | Status: DC | PRN
Start: 2023-08-21 — End: 2023-08-21
  Administered 2023-08-21: 150 ug/kg/min via INTRAVENOUS

## 2023-08-21 NOTE — Anesthesia Procedure Notes (Signed)
 Procedure Name: MAC Date/Time: 08/21/2023 8:41 AM  Performed by: Lacretia Camelia NOVAK, CRNAPre-anesthesia Checklist: Patient identified, Emergency Drugs available, Suction available and Patient being monitored Patient Re-evaluated:Patient Re-evaluated prior to induction Oxygen Delivery Method: Nasal cannula

## 2023-08-21 NOTE — Anesthesia Preprocedure Evaluation (Signed)
 Anesthesia Evaluation  Patient identified by MRN, date of birth, ID band Patient awake    Reviewed: Allergy & Precautions, NPO status , Patient's Chart, lab work & pertinent test results  History of Anesthesia Complications Negative for: history of anesthetic complications  Airway Mallampati: II  TM Distance: >3 FB Neck ROM: Full    Dental no notable dental hx. (+) Teeth Intact, Dental Advisory Given   Pulmonary neg pulmonary ROS, neg sleep apnea, neg COPD, Patient abstained from smoking.Not current smoker   Pulmonary exam normal breath sounds clear to auscultation       Cardiovascular Exercise Tolerance: Good (-) hypertension(-) CAD and (-) Past MI negative cardio ROS (-) dysrhythmias  Rhythm:Regular Rate:Normal - Systolic murmurs    Neuro/Psych  PSYCHIATRIC DISORDERS       Neuromuscular disease    GI/Hepatic ,neg GERD  ,,(+)     (-) substance abuse    Endo/Other  neg diabetes    Renal/GU      Musculoskeletal   Abdominal   Peds  Hematology   Anesthesia Other Findings Past Medical History: No date: Arthritis     Comment:  rheumatoid arthritis-OFF METHOTREXATE SINCE 2016 AND               DOING WELL PER PT (09-13-15) No date: Asthma     Comment:  high school No date: Family history of adverse reaction to anesthesia     Comment:  MOM-N/V No date: History of abnormal cervical Pap smear  Reproductive/Obstetrics                              Anesthesia Physical Anesthesia Plan  ASA: 2  Anesthesia Plan: General   Post-op Pain Management: Minimal or no pain anticipated   Induction: Intravenous  PONV Risk Score and Plan: 3 and Propofol  infusion, TIVA and Ondansetron   Airway Management Planned: Nasal Cannula  Additional Equipment: None  Intra-op Plan:   Post-operative Plan:   Informed Consent: I have reviewed the patients History and Physical, chart, labs and discussed the  procedure including the risks, benefits and alternatives for the proposed anesthesia with the patient or authorized representative who has indicated his/her understanding and acceptance.     Dental advisory given  Plan Discussed with: CRNA and Surgeon  Anesthesia Plan Comments: (Discussed risks of anesthesia with patient, including possibility of difficulty with spontaneous ventilation under anesthesia necessitating airway intervention, PONV, and rare risks such as cardiac or respiratory or neurological events, and allergic reactions. Discussed the role of CRNA in patient's perioperative care. Patient understands.)        Anesthesia Quick Evaluation

## 2023-08-21 NOTE — Anesthesia Postprocedure Evaluation (Signed)
 Anesthesia Post Note  Patient: Kerri Sullivan  Procedure(s) Performed: COLONOSCOPY  Patient location during evaluation: Endoscopy Anesthesia Type: General Level of consciousness: awake and alert Pain management: pain level controlled Vital Signs Assessment: post-procedure vital signs reviewed and stable Respiratory status: spontaneous breathing, nonlabored ventilation, respiratory function stable and patient connected to nasal cannula oxygen Cardiovascular status: blood pressure returned to baseline and stable Postop Assessment: no apparent nausea or vomiting Anesthetic complications: no   No notable events documented.   Last Vitals:  Vitals:   08/21/23 0918 08/21/23 0926  BP: 98/74 117/80  Pulse: 73 65  Resp: 18 12  Temp:    SpO2: 100% 100%    Last Pain:  Vitals:   08/21/23 0926  TempSrc:   PainSc: 0-No pain                 Debby Mines

## 2023-08-21 NOTE — Op Note (Signed)
 [facility] Gastroenterology Patient Name: Kerri Sullivan Procedure Date: 08/21/2023 8:32 AM MRN: [id number] Account #: [id number] Date of Birth: 12-08-1970 Admit Type: Outpatient Age: 53 Room: [facility] ENDO ROOM 4 Gender: Female Note Status: Finalized Instrument Name: [id number] Procedure:             Colonoscopy Indications:           Screening for colorectal malignant neoplasm Providers:             Rogelia Copping MD, MD Referring MD:          [name], MD (Referring MD) Medicines:             Propofol  per Anesthesia Complications:         No immediate complications. Procedure:             Pre-Anesthesia Assessment:                        - Prior to the procedure, a History and Physical was                         performed, and patient medications and allergies were                         reviewed. The patient's tolerance of previous                         anesthesia was also reviewed. The risks and benefits                         of the procedure and the sedation options and risks                         were discussed with the patient. All questions were                         answered, and informed consent was obtained. Prior                         Anticoagulants: The patient has taken no anticoagulant                         or antiplatelet agents. ASA Grade Assessment: II - A                         patient with mild systemic disease. After reviewing                         the risks and benefits, the patient was deemed in                         satisfactory condition to undergo the procedure.                        After obtaining informed consent, the colonoscope was                         passed under direct vision. Throughout the procedure,  the patient's blood pressure, pulse, and oxygen                         saturations were monitored continuously. The                         Colonoscope was introduced through the  anus and                         advanced to the the cecum, identified by appendiceal                         orifice and ileocecal valve. The colonoscopy was                         performed without difficulty. The patient tolerated                         the procedure well. The quality of the bowel                         preparation was excellent. Findings:      The perianal and digital rectal examinations were normal.      Non-bleeding internal hemorrhoids were found during retroflexion. The       hemorrhoids were Grade II (internal hemorrhoids that prolapse but reduce       spontaneously). Impression:            - Non-bleeding internal hemorrhoids.                        - No specimens collected. Recommendation:        - Discharge patient to home.                        - Resume previous diet.                        - Continue present medications.                        - Repeat colonoscopy in 10 years for screening                         purposes. Procedure Code(s):     --- Professional ---                        [phone], Colonoscopy, flexible; diagnostic, including                         collection of specimen(s) by brushing or washing, when                         performed (separate procedure) Diagnosis Code(s):     --- Professional ---                        Z12.11, Encounter for screening for malignant neoplasm                         of colon CPT copyright 2022  American Medical Association. All rights reserved. The codes documented in this report are preliminary and upon coder review may  be revised to meet current compliance requirements. Rogelia Copping MD, MD 08/21/2023 9:02:40 AM This report has been signed electronically. Number of Addenda: 0 Note Initiated On: 08/21/2023 8:32 AM Scope Withdrawal Time: 0 hours 9 minutes 28 seconds  Total Procedure Duration: 0 hours 15 minutes 31 seconds  Estimated Blood Loss:  Estimated blood loss: none.      [facility]

## 2023-08-21 NOTE — Transfer of Care (Signed)
 Immediate Anesthesia Transfer of Care Note  Patient: Kerri Sullivan  Procedure(s) Performed: COLONOSCOPY  Patient Location: PACU and Endoscopy Unit  Anesthesia Type:General  Level of Consciousness: drowsy and patient cooperative  Airway & Oxygen Therapy: Patient Spontanous Breathing  Post-op Assessment: Report given to RN and Post -op Vital signs reviewed and stable  Post vital signs: Reviewed and stable  Last Vitals:  Vitals Value Taken Time  BP 100/62 08/21/23 09:06  Temp    Pulse 72 08/21/23 09:06  Resp 18 08/21/23 09:06  SpO2 98 % 08/21/23 09:06  Vitals shown include unfiled device data.  Last Pain:  Vitals:   08/21/23 0822  TempSrc: Tympanic         Complications: No notable events documented.

## 2023-08-21 NOTE — OR Nursing (Signed)
 Pt stated unable to produce urine for preg test, today last day of menstral cycle; notified Anest Dr Leavy aware

## 2023-08-21 NOTE — H&P (Addendum)
 Kerri Copping, MD [facility] [address], [address] [place], [place] [address] [phone] Fax : [phone]  Primary Care Physician:  Glendia Shad, MD Primary Gastroenterologist:  Dr. Copping  Pre-Procedure History & Physical: HPI:  Kerri Sullivan is a 53 y.o. female is here for an colonoscopy.   Past Medical History:  Diagnosis Date   Arthritis    rheumatoid arthritis-OFF METHOTREXATE SINCE 2016 AND DOING WELL PER PT (09-13-15)   Asthma    high school   Family history of adverse reaction to anesthesia    MOM-N/V   History of abnormal cervical Pap smear     Past Surgical History:  Procedure Laterality Date   APPENDECTOMY  June 2021   CHOLECYSTECTOMY  1998   FOOT SURGERY Left    repair of tendon   IR RADIOLOGIST EVAL & MGMT  07/14/2019   appenditis   JOINT REPLACEMENT  February 2020   Left Thumb   KNEE SURGERY  1987   right x2 and left x 1-arthroscopy   LIPOMA EXCISION N/A 09/21/2015   Procedure: EXCISION LIPOMA BACK OF NECK;  Surgeon: Louanne KANDICE Muse, MD;  Location: [facility] ORS;  Service: General;  Laterality: N/A;   XI ROBOTIC LAPAROSCOPIC ASSISTED APPENDECTOMY N/A 08/02/2019   Procedure: XI ROBOTIC LAPAROSCOPIC ASSISTED APPENDECTOMY;  Surgeon: Rodolph Romano, MD;  Location: [facility] ORS;  Service: General;  Laterality: N/A;    Prior to Admission medications   Medication Sig Start Date End Date Taking? Authorizing Provider  fexofenadine (ALLEGRA) 180 MG tablet Take 180 mg by mouth daily.    [provider]  ibuprofen (ADVIL) 200 MG tablet Take 800 mg by mouth every 8 (eight) hours as needed (pain.).    [provider]  Magnesium 500 MG TABS Take 500 mg by mouth daily.    [provider]  Multiple Vitamin (MULTIVITAMIN WITH MINERALS) TABS tablet Take 1 tablet by mouth daily.    [provider]  naproxen sodium (ALEVE) 220 MG tablet Take 440 mg by mouth 2 (two) times daily as needed (pain.).    [provider]  TURMERIC  PO Take 1 tablet by mouth daily.    [provider]  venlafaxine  XR (EFFEXOR  XR) 75 MG 24 hr capsule Take 1 capsule (75 mg total) by mouth daily with breakfast. 07/01/23   Glendia Shad, MD  vitamin B-12 (CYANOCOBALAMIN ) 100 MCG tablet Take 100 mcg by mouth daily.    [provider]  Wheat Dextrin (BENEFIBER DRINK MIX) PACK Take 1 Dose by mouth daily.    [provider]    Allergies as of 07/16/2023   (No Known Allergies)    Family History  Problem Relation Age of Onset   Osteoporosis Mother    Arthritis Mother    Stroke Mother    Heart disease Father    Hypertension Father    Arthritis Father    COPD Father    Diabetes Brother    Alcoholism Brother    Alcohol abuse Brother    Early death Brother    Suicidality Brother    Early death Brother    Diabetes Maternal Uncle    Cancer Paternal Aunt 20       breast   Diabetes Maternal Grandmother    Stroke Maternal Grandmother    Stroke Paternal Grandmother    Stroke Paternal Grandfather    Cancer Cousin        colon   Lung cancer Cousin        died late  30s   Breast cancer Neg Hx     Social History   Socioeconomic History   Marital status: Divorced    Spouse name: Not on file   Number of children: 1   Years of education: 16   Highest education level: Master's degree (e.g., MA, MS, MEng, MEd, MSW, MBA)  Occupational History   Occupation: Teacher  Tobacco Use   Smoking status: Never   Smokeless tobacco: Never   Tobacco comments:    Quit in 1997  Vaping Use   Vaping status: Never Used  Substance and Sexual Activity   Alcohol use: Yes    Alcohol/week: 1.0 standard drink of alcohol    Types: 1 Shots of liquor per week    Comment: RARE   Drug use: No   Sexual activity: Not Currently    Birth control/protection: None  Other Topics Concern   Not on file  Social History Narrative   Not on file   Social Drivers of Health   Financial Resource Strain: Low Risk  (02/27/2023)   Overall  Financial Resource Strain (CARDIA)    Difficulty of Paying Living Expenses: Not very hard  Food Insecurity: No Food Insecurity (02/27/2023)   Hunger Vital Sign    Worried About Running Out of Food in the Last Year: Never true    Ran Out of Food in the Last Year: Never true  Transportation Needs: No Transportation Needs (02/27/2023)   PRAPARE - Administrator, Civil Service (Medical): No    Lack of Transportation (Non-Medical): No  Physical Activity: Insufficiently Active (02/27/2023)   Exercise Vital Sign    Days of Exercise per Week: 2 days    Minutes of Exercise per Session: 20 min  Stress: Stress Concern Present (02/27/2023)   [employer] of Occupational Health - Occupational Stress Questionnaire    Feeling of Stress : To some extent  Social Connections: Moderately Integrated (02/27/2023)   Social Connection and Isolation Panel    Frequency of Communication with Friends and Family: More than three times a week    Frequency of Social Gatherings with Friends and Family: Three times a week    Attends Religious Services: 1 to 4 times per year    Active Member of Clubs or Organizations: Yes    Attends Engineer, structural: More than 4 times per year    Marital Status: Divorced  Catering manager Violence: Not on file    Review of Systems: See HPI, otherwise negative ROS  Physical Exam: There were no vitals taken for this visit. General:   Alert,  pleasant and cooperative in NAD Head:  Normocephalic and atraumatic. Neck:  Supple; no masses or thyromegaly. Lungs:  Clear throughout to auscultation.    Heart:  Regular rate and rhythm. Abdomen:  Soft, nontender and nondistended. Normal bowel sounds, without guarding, and without rebound.   Neurologic:  Alert and  oriented x4;  grossly normal neurologically.  Impression/Plan: Kerri Sullivan is here for an colonoscopy to be performed for screening   Risks, benefits, limitations, and alternatives regarding   colonoscopy have been reviewed with the patient.  Questions have been answered.  All parties agreeable.   Kerri Copping, MD  08/21/2023, 7:51 AM

## 2023-08-22 ENCOUNTER — Encounter: Payer: Self-pay | Admitting: Gastroenterology

## 2023-09-10 ENCOUNTER — Other Ambulatory Visit: Payer: Self-pay | Admitting: Internal Medicine

## 2023-10-13 ENCOUNTER — Other Ambulatory Visit: Payer: Self-pay

## 2023-10-13 ENCOUNTER — Encounter: Payer: Self-pay | Admitting: Internal Medicine

## 2023-10-13 MED ORDER — VENLAFAXINE HCL ER 75 MG PO CP24: 75.0000 mg | ORAL_CAPSULE | Freq: Every day | ORAL | 2 refills | Status: DC

## 2023-12-04 ENCOUNTER — Ambulatory Visit: Admitting: Internal Medicine

## 2023-12-04 VITALS — BP 116/72 | HR 95 | Temp 97.7°F | Ht 69.0 in | Wt 211.0 lb

## 2023-12-04 DIAGNOSIS — M069 Rheumatoid arthritis, unspecified: Secondary | ICD-10-CM | POA: Diagnosis not present

## 2023-12-04 DIAGNOSIS — D649 Anemia, unspecified: Secondary | ICD-10-CM

## 2023-12-04 DIAGNOSIS — F439 Reaction to severe stress, unspecified: Secondary | ICD-10-CM

## 2023-12-04 DIAGNOSIS — Z1231 Encounter for screening mammogram for malignant neoplasm of breast: Secondary | ICD-10-CM

## 2023-12-04 MED ORDER — VENLAFAXINE HCL ER 150 MG PO CP24: 150.0000 mg | ORAL_CAPSULE | Freq: Every day | ORAL | 3 refills | Status: DC

## 2023-12-04 NOTE — Progress Notes (Signed)
 Subjective:    Patient ID: Kerri Sullivan, female    DOB: [birth year], 53 y.o.   MRN: [id number]  Patient here for  Chief Complaint  Patient presents with   Medical Management of Chronic Issues    HPI Here for a scheduled follow up - follow up regarding IDA, RA and increased stress. Continues on effexor . F/u Dr Camella 07/2023 - [facility] arthritis.  Colonoscopy 08/21/23 - non bleeding internal hemorrhoids. Recommended f/u colonoscopy in 10 years. Taking effexor . Is sleeping better. Feels has helped. Discussed increasing dose. No chest pain or sob reported. No abdominal pain or bowel change reported.    Past Medical History:  Diagnosis Date   Arthritis    rheumatoid arthritis-OFF METHOTREXATE SINCE 2016 AND DOING WELL PER PT (09-13-15)   Asthma    high school   Family history of adverse reaction to anesthesia    MOM-N/V   History of abnormal cervical Pap smear    Past Surgical History:  Procedure Laterality Date   APPENDECTOMY  June 2021   CHOLECYSTECTOMY  1998   COLONOSCOPY N/A 08/21/2023   Procedure: COLONOSCOPY;  Surgeon: Jinny Carmine, MD;  Location: [facility] ENDOSCOPY;  Service: Endoscopy;  Laterality: N/A;   FOOT SURGERY Left    repair of tendon   IR RADIOLOGIST EVAL & MGMT  07/14/2019   appenditis   JOINT REPLACEMENT  February 2020   Left Thumb   KNEE SURGERY  1987   right x2 and left x 1-arthroscopy   LIPOMA EXCISION N/A 09/21/2015   Procedure: EXCISION LIPOMA BACK OF NECK;  Surgeon: Louanne KANDICE Muse, MD;  Location: [facility] ORS;  Service: General;  Laterality: N/A;   XI ROBOTIC LAPAROSCOPIC ASSISTED APPENDECTOMY N/A 08/02/2019   Procedure: XI ROBOTIC LAPAROSCOPIC ASSISTED APPENDECTOMY;  Surgeon: Rodolph Romano, MD;  Location: [facility] ORS;  Service: General;  Laterality: N/A;   Family History  Problem Relation Age of Onset   Osteoporosis Mother    Arthritis Mother    Stroke Mother    Heart disease Father    Hypertension Father    Arthritis Father    COPD Father     Diabetes Brother    Alcoholism Brother    Alcohol abuse Brother    Early death Brother    Suicidality Brother    Early death Brother    Diabetes Maternal Uncle    Cancer Paternal Aunt 46       breast   Diabetes Maternal Grandmother    Stroke Maternal Grandmother    Stroke Paternal Grandmother    Stroke Paternal Grandfather    Cancer Cousin        colon   Lung cancer Cousin        died late 11s   Breast cancer Neg Hx    Social History   Socioeconomic History   Marital status: Divorced    Spouse name: Not on file   Number of children: 1   Years of education: 16   Highest education level: Master's degree (e.g., MA, MS, MEng, MEd, MSW, MBA)  Occupational History   Occupation: Runner, Broadcasting/film/video  Tobacco Use   Smoking status: Never   Smokeless tobacco: Never   Tobacco comments:    Quit in 1997  Vaping Use   Vaping status: Never Used  Substance and Sexual Activity   Alcohol use: Yes    Alcohol/week: 1.0 standard drink of alcohol    Types: 1 Shots of liquor per week    Comment: RARE   Drug use: No  Sexual activity: Not Currently    Birth control/protection: None  Other Topics Concern   Not on file  Social History Narrative   Not on file   Social Drivers of Health   Financial Resource Strain: Low Risk  (12/04/2023)   Overall Financial Resource Strain (CARDIA)    Difficulty of Paying Living Expenses: Not hard at all  Food Insecurity: No Food Insecurity (12/04/2023)   Hunger Vital Sign    Worried About Running Out of Food in the Last Year: Never true    Ran Out of Food in the Last Year: Never true  Transportation Needs: No Transportation Needs (12/04/2023)   PRAPARE - Administrator, Civil Service (Medical): No    Lack of Transportation (Non-Medical): No  Physical Activity: Insufficiently Active (12/04/2023)   Exercise Vital Sign    Days of Exercise per Week: 3 days    Minutes of Exercise per Session: 20 min  Stress: Stress Concern Present (12/04/2023)    [employer] of Occupational Health - Occupational Stress Questionnaire    Feeling of Stress: To some extent  Social Connections: Socially Integrated (12/04/2023)   Social Connection and Isolation Panel    Frequency of Communication with Friends and Family: More than three times a week    Frequency of Social Gatherings with Friends and Family: Twice a week    Attends Religious Services: More than 4 times per year    Active Member of [employer] or Organizations: Yes    Attends Engineer, Structural: More than 4 times per year    Marital Status: Living with partner     Review of Systems  Constitutional:  Negative for appetite change and unexpected weight change.  HENT:  Negative for congestion and sinus pressure.   Respiratory:  Negative for cough, chest tightness and shortness of breath.   Cardiovascular:  Negative for chest pain, palpitations and leg swelling.  Gastrointestinal:  Negative for abdominal pain, diarrhea, nausea and vomiting.  Genitourinary:  Negative for difficulty urinating and dysuria.  Musculoskeletal:  Negative for joint swelling and myalgias.  Skin:  Negative for color change and rash.  Neurological:  Negative for dizziness and headaches.  Psychiatric/Behavioral:  Negative for agitation and dysphoric mood.        Objective:     BP 116/72   Pulse 95   Temp 97.7 F (36.5 C) (Oral)   Ht 5' 9 (1.753 m)   Wt 211 lb (95.7 kg)   SpO2 99%   BMI 31.16 kg/m  Wt Readings from Last 3 Encounters:  12/04/23 211 lb (95.7 kg)  08/21/23 208 lb 9.6 oz (94.6 kg)  07/30/23 213 lb (96.6 kg)    Physical Exam Vitals reviewed.  Constitutional:      General: She is not in acute distress.    Appearance: Normal appearance.  HENT:     Head: Normocephalic and atraumatic.     Right Ear: External ear normal.     Left Ear: External ear normal.     Mouth/Throat:     Pharynx: No oropharyngeal exudate or posterior oropharyngeal erythema.  Eyes:     General: No  scleral icterus.       Right eye: No discharge.        Left eye: No discharge.     Conjunctiva/sclera: Conjunctivae normal.  Neck:     Thyroid : No thyromegaly.  Cardiovascular:     Rate and Rhythm: Normal rate and regular rhythm.  Pulmonary:     Effort: No  respiratory distress.     Breath sounds: Normal breath sounds. No wheezing.  Abdominal:     General: Bowel sounds are normal.     Palpations: Abdomen is soft.     Tenderness: There is no abdominal tenderness.  Musculoskeletal:        General: No swelling or tenderness.     Cervical back: Neck supple. No tenderness.  Lymphadenopathy:     Cervical: No cervical adenopathy.  Skin:    Findings: No erythema or rash.  Neurological:     Mental Status: She is alert.  Psychiatric:        Mood and Affect: Mood normal.        Behavior: Behavior normal.         Outpatient Encounter Medications as of 12/04/2023  Medication Sig   fexofenadine (ALLEGRA) 180 MG tablet Take 180 mg by mouth daily.   ibuprofen (ADVIL) 200 MG tablet Take 800 mg by mouth every 8 (eight) hours as needed (pain.).   Magnesium 500 MG TABS Take 500 mg by mouth daily.   Multiple Vitamin (MULTIVITAMIN WITH MINERALS) TABS tablet Take 1 tablet by mouth daily.   naproxen sodium (ALEVE) 220 MG tablet Take 440 mg by mouth 2 (two) times daily as needed (pain.).   TURMERIC PO Take 1 tablet by mouth daily.   venlafaxine  XR (EFFEXOR  XR) 150 MG 24 hr capsule Take 1 capsule (150 mg total) by mouth daily with breakfast.   vitamin B-12 (CYANOCOBALAMIN ) 100 MCG tablet Take 100 mcg by mouth daily.   Wheat Dextrin (BENEFIBER DRINK MIX) PACK Take 1 Dose by mouth daily.   [DISCONTINUED] venlafaxine  XR (EFFEXOR -XR) 75 MG 24 hr capsule Take 1 capsule (75 mg total) by mouth daily with breakfast.   No facility-administered encounter medications on file as of 12/04/2023.     Lab Results  Component Value Date   WBC 5.8 05/30/2023   HGB 14.4 05/30/2023   HCT 43.0 05/30/2023   PLT  259.0 05/30/2023   GLUCOSE 91 05/30/2023   CHOL 189 05/30/2023   TRIG 165.0 (H) 05/30/2023   HDL 53.20 05/30/2023   LDLCALC 103 (H) 05/30/2023   ALT 23 05/30/2023   AST 18 05/30/2023   NA 141 05/30/2023   K 4.0 05/30/2023   CL 104 05/30/2023   CREATININE 0.77 05/30/2023   BUN 12 05/30/2023   CO2 30 05/30/2023   TSH 1.16 02/27/2023       Assessment & Plan:  Stress Assessment & Plan: Increaseds stress as outlined. Currently on effexor  75mg  q day now. Appears this is helping. Is sleeping better. Would like to increase dose. Will increase to 150mg  q day. Follow.    Anemia, unspecified type Assessment & Plan: Follow [facility].    Rheumatoid arthritis, involving unspecified site, unspecified whether rheumatoid factor present [facility]) Assessment & Plan: Previously saw Dr Maryl. Issues with thumb pain as outlined. Seeing Dr Camella. Evaluated 07/2023 - [facility] arthritis. Follow.   Orders: -     Basic metabolic panel with GFR; Future -     Hepatic function panel; Future -     Lipid panel; Future  Encounter for screening mammogram for malignant neoplasm of breast  Other orders -     Venlafaxine  HCl ER; Take 1 capsule (150 mg total) by mouth daily with breakfast.  Dispense: 30 capsule; Refill: 3     [name], MD

## 2023-12-14 ENCOUNTER — Encounter: Payer: Self-pay | Admitting: Internal Medicine

## 2023-12-14 NOTE — Assessment & Plan Note (Signed)
 Increaseds stress as outlined. Currently on effexor  75mg  q day now. Appears this is helping. Is sleeping better. Would like to increase dose. Will increase to 150mg  q day. Follow.

## 2023-12-14 NOTE — Assessment & Plan Note (Signed)
 Follow [facility].

## 2023-12-14 NOTE — Assessment & Plan Note (Signed)
 Previously saw Dr Maryl. Issues with thumb pain as outlined. Seeing Dr Camella. Evaluated 07/2023 - [facility] arthritis. Follow.

## 2024-01-12 ENCOUNTER — Encounter: Payer: Self-pay | Admitting: Internal Medicine

## 2024-01-12 DIAGNOSIS — M79642 Pain in left hand: Secondary | ICD-10-CM | POA: Insufficient documentation

## 2024-03-05 ENCOUNTER — Ambulatory Visit: Admitting: Internal Medicine

## 2024-03-10 ENCOUNTER — Encounter: Payer: Self-pay | Admitting: Internal Medicine

## 2024-03-10 ENCOUNTER — Ambulatory Visit: Admitting: Internal Medicine

## 2024-03-10 DIAGNOSIS — D649 Anemia, unspecified: Secondary | ICD-10-CM

## 2024-03-10 DIAGNOSIS — M069 Rheumatoid arthritis, unspecified: Secondary | ICD-10-CM

## 2024-03-10 MED ORDER — VENLAFAXINE HCL ER 150 MG PO CP24: 150.0000 mg | ORAL_CAPSULE | Freq: Every day | ORAL | 1 refills | Status: AC

## 2024-03-10 NOTE — Progress Notes (Unsigned)
 "  Subjective:    Patient ID: Kerri Sullivan, female    DOB: [birth year], 54 y.o.   MRN: [id number]  Patient here for  Chief Complaint  Patient presents with   Medical Management of Chronic Issues    FOLLOW UP     HPI Here for a scheduled follow up - follow up regarding IDA, RA and increased stress. Continues on effexor . Last visit, increased effexor  to 150mg  q day. F/u Dr Camella 07/2023 - [facility] arthritis.  Colonoscopy 08/21/23 - non bleeding internal hemorrhoids. Recommended f/u colonoscopy in 10 years. Saw ortho 12/27/23 and 01/06/24 - left hand contusion and pain vs a nondisplaced fifth metacarpal fracture. Recommended to continue brace.    Past Medical History:  Diagnosis Date   Arthritis    rheumatoid arthritis-OFF METHOTREXATE SINCE 2016 AND DOING WELL PER PT (09-13-15)   Asthma    high school   Family history of adverse reaction to anesthesia    MOM-N/V   History of abnormal cervical Pap smear    Past Surgical History:  Procedure Laterality Date   APPENDECTOMY  June 2021   CHOLECYSTECTOMY  1998   COLONOSCOPY N/A 08/21/2023   Procedure: COLONOSCOPY;  Surgeon: Jinny Carmine, MD;  Location: [facility];  Service: Endoscopy;  Laterality: N/A;   FOOT SURGERY Left    repair of tendon   IR RADIOLOGIST EVAL & MGMT  07/14/2019   appenditis   JOINT REPLACEMENT  February 2020   Left Thumb   KNEE SURGERY  1987   right x2 and left x 1-arthroscopy   LIPOMA EXCISION N/A 09/21/2015   Procedure: EXCISION LIPOMA BACK OF NECK;  Surgeon: Louanne KANDICE Muse, MD;  Location: [facility] ORS;  Service: General;  Laterality: N/A;   XI ROBOTIC LAPAROSCOPIC ASSISTED APPENDECTOMY N/A 08/02/2019   Procedure: XI ROBOTIC LAPAROSCOPIC ASSISTED APPENDECTOMY;  Surgeon: Rodolph Romano, MD;  Location: [facility] ORS;  Service: General;  Laterality: N/A;   Family History  Problem Relation Age of Onset   Osteoporosis Mother    Arthritis Mother    Stroke Mother    Heart disease Father    Hypertension Father     Arthritis Father    COPD Father    Diabetes Brother    Alcoholism Brother    Alcohol abuse Brother    Early death Brother    Suicidality Brother    Early death Brother    Diabetes Maternal Uncle    Cancer Paternal Aunt 31       breast   Diabetes Maternal Grandmother    Stroke Maternal Grandmother    Stroke Paternal Grandmother    Stroke Paternal Grandfather    Cancer Cousin        colon   Lung cancer Cousin        died late 76s   Breast cancer Neg Hx    Social History   Socioeconomic History   Marital status: Divorced    Spouse name: Not on file   Number of children: 1   Years of education: 16   Highest education level: Master's degree (e.g., MA, MS, MEng, MEd, MSW, MBA)  Occupational History   Occupation: Runner, Broadcasting/film/video  Tobacco Use   Smoking status: Never   Smokeless tobacco: Never   Tobacco comments:    Quit in 1997  Vaping Use   Vaping status: Never Used  Substance and Sexual Activity   Alcohol use: Yes    Alcohol/week: 1.0 standard drink of alcohol    Types: 1 Shots of liquor per  week    Comment: RARE   Drug use: No   Sexual activity: Not Currently    Birth control/protection: None  Other Topics Concern   Not on file  Social History Narrative   Not on file   Social Drivers of Health   Tobacco Use: Low Risk (03/10/2024)   Patient History    Smoking Tobacco Use: Never    Smokeless Tobacco Use: Never    Passive Exposure: Not on file  Financial Resource Strain: Low Risk (12/04/2023)   Overall Financial Resource Strain (CARDIA)    Difficulty of Paying Living Expenses: Not hard at all  Food Insecurity: No Food Insecurity (12/04/2023)   Epic    Worried About Programme Researcher, Broadcasting/film/video in the Last Year: Never true    Ran Out of Food in the Last Year: Never true  Transportation Needs: No Transportation Needs (12/04/2023)   Epic    Lack of Transportation (Medical): No    Lack of Transportation (Non-Medical): No  Physical Activity: Insufficiently Active (12/04/2023)    Exercise Vital Sign    Days of Exercise per Week: 3 days    Minutes of Exercise per Session: 20 min  Stress: Stress Concern Present (12/04/2023)   [employer] of Occupational Health - Occupational Stress Questionnaire    Feeling of Stress: To some extent  Social Connections: Socially Integrated (12/04/2023)   Social Connection and Isolation Panel    Frequency of Communication with Friends and Family: More than three times a week    Frequency of Social Gatherings with Friends and Family: Twice a week    Attends Religious Services: More than 4 times per year    Active Member of [employer] or Organizations: Yes    Attends Banker Meetings: More than 4 times per year    Marital Status: Living with partner  Depression (PHQ2-9): Low Risk (12/04/2023)   Depression (PHQ2-9)    PHQ-2 Score: 3  Alcohol Screen: Low Risk (12/04/2023)   Alcohol Screen    Last Alcohol Screening Score (AUDIT): 2  Housing: Low Risk (12/04/2023)   Epic    Unable to Pay for Housing in the Last Year: No    Number of Times Moved in the Last Year: 1    Homeless in the Last Year: No  Utilities: Not on file  Health Literacy: Not on file     Review of Systems     Objective:     BP 128/76   Pulse 89   Temp 98.1 F (36.7 C) (Oral)   Ht 5' 9 (1.753 m)   Wt 211 lb (95.7 kg)   SpO2 97%   BMI 31.16 kg/m  Wt Readings from Last 3 Encounters:  03/10/24 211 lb (95.7 kg)  12/04/23 211 lb (95.7 kg)  08/21/23 208 lb 9.6 oz (94.6 kg)    Physical Exam  {Perform Simple Foot Exam  Perform Detailed exam:1} {Insert foot Exam (Optional):30965}   Outpatient Encounter Medications as of 03/10/2024  Medication Sig   fexofenadine (ALLEGRA) 180 MG tablet Take 180 mg by mouth daily.   ibuprofen (ADVIL) 200 MG tablet Take 800 mg by mouth every 8 (eight) hours as needed (pain.).   Magnesium 500 MG TABS Take 500 mg by mouth daily.   Multiple Vitamin (MULTIVITAMIN WITH MINERALS) TABS tablet Take 1 tablet by mouth  daily.   naproxen sodium (ALEVE) 220 MG tablet Take 440 mg by mouth 2 (two) times daily as needed (pain.).   TURMERIC PO Take 1 tablet by  mouth daily.   venlafaxine  XR (EFFEXOR  XR) 150 MG 24 hr capsule Take 1 capsule (150 mg total) by mouth daily with breakfast.   venlafaxine  XR (EFFEXOR -XR) 75 MG 24 hr capsule Take 75 mg by mouth every morning.   vitamin B-12 (CYANOCOBALAMIN ) 100 MCG tablet Take 100 mcg by mouth daily.   Wheat Dextrin (BENEFIBER DRINK MIX) PACK Take 1 Dose by mouth daily.   No facility-administered encounter medications on file as of 03/10/2024.     Lab Results  Component Value Date   WBC 5.8 05/30/2023   HGB 14.4 05/30/2023   HCT 43.0 05/30/2023   PLT 259.0 05/30/2023   GLUCOSE 91 05/30/2023   CHOL 189 05/30/2023   TRIG 165.0 (H) 05/30/2023   HDL 53.20 05/30/2023   LDLCALC 103 (H) 05/30/2023   ALT 23 05/30/2023   AST 18 05/30/2023   NA 141 05/30/2023   K 4.0 05/30/2023   CL 104 05/30/2023   CREATININE 0.77 05/30/2023   BUN 12 05/30/2023   CO2 30 05/30/2023   TSH 1.16 02/27/2023    No results found.     Assessment & Plan:  Rheumatoid arthritis, involving unspecified site, unspecified whether rheumatoid factor present (HCC)  Anemia, unspecified type     [name], MD "

## 2024-06-08 ENCOUNTER — Encounter: Admitting: Internal Medicine
